# Patient Record
Sex: Female | Born: 1985 | Race: White | Hispanic: No | Marital: Married | Smoking: Never smoker
Health system: Southern US, Community
[De-identification: ages and names within clinical notes are randomized; demographics above are authoritative.]

## PROBLEM LIST (undated history)

## (undated) ENCOUNTER — Inpatient Hospital Stay (HOSPITAL_COMMUNITY): Payer: Self-pay

## (undated) DIAGNOSIS — E282 Polycystic ovarian syndrome: Secondary | ICD-10-CM

## (undated) DIAGNOSIS — Z973 Presence of spectacles and contact lenses: Secondary | ICD-10-CM

## (undated) DIAGNOSIS — Z8759 Personal history of other complications of pregnancy, childbirth and the puerperium: Secondary | ICD-10-CM

## (undated) DIAGNOSIS — Z789 Other specified health status: Secondary | ICD-10-CM

## (undated) DIAGNOSIS — Z87820 Personal history of traumatic brain injury: Secondary | ICD-10-CM

## (undated) DIAGNOSIS — O021 Missed abortion: Secondary | ICD-10-CM

## (undated) HISTORY — PX: OTHER SURGICAL HISTORY: SHX169

---
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
## 2016-04-02 ENCOUNTER — Encounter (HOSPITAL_COMMUNITY): Payer: Self-pay

## 2016-04-02 ENCOUNTER — Inpatient Hospital Stay (HOSPITAL_COMMUNITY)
Admission: AD | Admit: 2016-04-02 | Discharge: 2016-04-03 | Disposition: A | Discharge status: Home / Self Care | Payer: BLUE CROSS/BLUE SHIELD | Source: Ambulatory Visit | Attending: Obstetrics and Gynecology | Admitting: Obstetrics and Gynecology

## 2016-04-02 ENCOUNTER — Inpatient Hospital Stay (HOSPITAL_COMMUNITY): Payer: BLUE CROSS/BLUE SHIELD

## 2016-04-02 DIAGNOSIS — R109 Unspecified abdominal pain: Secondary | ICD-10-CM | POA: Diagnosis present

## 2016-04-02 DIAGNOSIS — O209 Hemorrhage in early pregnancy, unspecified: Secondary | ICD-10-CM

## 2016-04-02 DIAGNOSIS — O039 Complete or unspecified spontaneous abortion without complication: Secondary | ICD-10-CM | POA: Insufficient documentation

## 2016-04-02 DIAGNOSIS — O4691 Antepartum hemorrhage, unspecified, first trimester: Secondary | ICD-10-CM | POA: Diagnosis not present

## 2016-04-02 LAB — URINALYSIS, ROUTINE W REFLEX MICROSCOPIC
BILIRUBIN URINE: NEGATIVE
GLUCOSE, UA: NEGATIVE mg/dL
KETONES UR: NEGATIVE mg/dL
Leukocytes, UA: NEGATIVE
NITRITE: NEGATIVE
PH: 5.5 (ref 5.0–8.0)
Protein, ur: NEGATIVE mg/dL
SPECIFIC GRAVITY, URINE: 1.015 (ref 1.005–1.030)

## 2016-04-02 LAB — WET PREP, GENITAL
Clue Cells Wet Prep HPF POC: NONE SEEN
Sperm: NONE SEEN
TRICH WET PREP: NONE SEEN
YEAST WET PREP: NONE SEEN

## 2016-04-02 LAB — CBC
HCT: 38.4 % (ref 36.0–46.0)
Hemoglobin: 13.2 g/dL (ref 12.0–15.0)
MCH: 30.5 pg (ref 26.0–34.0)
MCHC: 34.4 g/dL (ref 30.0–36.0)
MCV: 88.7 fL (ref 78.0–100.0)
Platelets: 367 10*3/uL (ref 150–400)
RBC: 4.33 MIL/uL (ref 3.87–5.11)
RDW: 12.9 % (ref 11.5–15.5)
WBC: 10.7 10*3/uL — AB (ref 4.0–10.5)

## 2016-04-02 LAB — HCG, QUANTITATIVE, PREGNANCY: HCG, BETA CHAIN, QUANT, S: 66 m[IU]/mL — AB (ref <5)

## 2016-04-02 LAB — URINE MICROSCOPIC-ADD ON

## 2016-04-02 LAB — POCT PREGNANCY, URINE: Preg Test, Ur: POSITIVE — AB

## 2016-04-02 NOTE — MAU Provider Note (Signed)
MAU HISTORY AND PHYSICAL  Chief Complaint:  Possible Pregnancy; Vaginal Bleeding; and Abdominal Pain   Julie [name] is a 30 y.o.  G1P0 at Unknown presenting for Possible Pregnancy; Vaginal Bleeding; and Abdominal Pain  Positve upt 5 days ago. Cramping on and off, mild, since Tuesday, but much stronger and constant today. Spotting today, passed some clots. No fevers, no dysuria.   History reviewed. No pertinent past medical history.  Past Surgical History  Procedure Laterality Date  . Acl repair      No family history on file.  Social History  Substance Use Topics  . Smoking status: Never Smoker   . Smokeless tobacco: None  . Alcohol Use: No    No Known Allergies  Prescriptions prior to admission  Medication Sig Dispense Refill Last Dose  . acetaminophen (TYLENOL) 500 MG tablet Take 500 mg by mouth every 6 (six) hours as needed.   04/02/2016 at 1900  . Prenatal Vit-Fe Fumarate-FA (PRENATAL MULTIVITAMIN) TABS tablet Take 1 tablet by mouth daily at 12 noon.   04/02/2016 at Unknown time    Review of Systems - Negative except for what is mentioned in HPI.  Physical Exam  Blood pressure 138/76, pulse 70, temperature 97.9 F (36.6 C), temperature source Oral, resp. rate 16, height 5\' 4"  (1.626 m), last menstrual period 12/19/2015, SpO2 98 %. GENERAL: Well-developed, well-nourished female in no acute distress.  LUNGS: Clear to auscultation bilaterally.  HEART: Regular rate and rhythm. ABDOMEN: Soft, nontender, nondistended, gravid.  EXTREMITIES: Nontender, no edema, 2+ distal pulses. SSE: closed cervix, moderate amount dark red blood, no visible products   Labs: Results for orders placed or performed during the hospital encounter of 04/02/16 (from the past 24 hour(s))  Urinalysis, Routine w reflex microscopic (not [facility])   Collection Time: 04/02/16 10:17 PM  Result Value Ref Range   Color, Urine YELLOW YELLOW   APPearance CLEAR CLEAR   Specific Gravity, Urine 1.015 1.005  - 1.030   pH 5.5 5.0 - 8.0   Glucose, UA NEGATIVE NEGATIVE mg/dL   Hgb urine dipstick LARGE (A) NEGATIVE   Bilirubin Urine NEGATIVE NEGATIVE   Ketones, ur NEGATIVE NEGATIVE mg/dL   Protein, ur NEGATIVE NEGATIVE mg/dL   Nitrite NEGATIVE NEGATIVE   Leukocytes, UA NEGATIVE NEGATIVE  Urine microscopic-add on   Collection Time: 04/02/16 10:17 PM  Result Value Ref Range   Squamous Epithelial / LPF 0-5 (A) NONE SEEN   WBC, UA 0-5 0 - 5 WBC/hpf   RBC / HPF 0-5 0 - 5 RBC/hpf   Bacteria, UA RARE (A) NONE SEEN  Pregnancy, urine POC   Collection Time: 04/02/16 10:26 PM  Result Value Ref Range   Preg Test, Ur POSITIVE (A) NEGATIVE  Wet prep, genital   Collection Time: 04/02/16 10:55 PM  Result Value Ref Range   Yeast Wet Prep HPF POC NONE SEEN NONE SEEN   Trich, Wet Prep NONE SEEN NONE SEEN   Clue Cells Wet Prep HPF POC NONE SEEN NONE SEEN   WBC, Wet Prep HPF POC MODERATE (A) NONE SEEN   Sperm NONE SEEN   CBC   Collection Time: 04/02/16 11:03 PM  Result Value Ref Range   WBC 10.7 (H) 4.0 - 10.5 K/uL   RBC 4.33 3.87 - 5.11 MIL/uL   Hemoglobin 13.2 12.0 - 15.0 g/dL   HCT 04.538.4 40.936.0 - 81.146.0 %   MCV 88.7 78.0 - 100.0 fL   MCH 30.5 26.0 - 34.0 pg   MCHC 34.4 30.0 -  36.0 g/dL   RDW 16.1 09.6 - 04.5 %   Platelets 367 150 - 400 K/uL  hCG, quantitative, pregnancy   Collection Time: 04/02/16 11:03 PM  Result Value Ref Range   hCG, Beta Chain, Quant, S 66 (H) <5 mIU/mL  Type and screen   Collection Time: 04/02/16 11:03 PM  Result Value Ref Range   ABO/RH(D) A POS    Antibody Screen PENDING    Sample Expiration 04/05/2016     Imaging Studies:  US Ob Comp Less 14 Wks  04/02/2016  CLINICAL DATA:  Bleeding and cramping. First-trimester pregnancy. Quantitative beta HCG 66. Fifteen weeks gestational age by LMP, but irregular cycles. EXAM: OBSTETRIC <14 WK [place] AND TRANSVAGINAL OB US TECHNIQUE: Both transabdominal and transvaginal ultrasound examinations were performed for complete evaluation of  the gestation as well as the maternal uterus, adnexal regions, and pelvic cul-de-sac. Transvaginal technique was performed to assess early pregnancy. COMPARISON:  None. FINDINGS: No intra or extrauterine gestational sac is identified. No adnexal mass or hemoperitoneum. Corpus luteum is noted on the left. Cystic structures at the level of the upper cervix are attributed to nabothian cysts. IMPRESSION: Pregnancy of unknown location with negative pelvic ultrasound. Based on clinical dates findings suggest spontaneous abortion. If intrauterine gestation has not been previously documented possibilities include intrauterine gestation too early to be sonographically visualized, spontaneous abortion, or ectopic pregnancy. Consider follow-up ultrasound in 10 days and serial quantitative beta HCG follow-up. Electronically Signed   By: Marnee Spring M.D.   On: 04/02/2016 23:56   US Ob Transvaginal  04/02/2016  CLINICAL DATA:  Bleeding and cramping. First-trimester pregnancy. Quantitative beta HCG 66. Fifteen weeks gestational age by LMP, but irregular cycles. EXAM: OBSTETRIC <14 WK [place] AND TRANSVAGINAL OB US TECHNIQUE: Both transabdominal and transvaginal ultrasound examinations were performed for complete evaluation of the gestation as well as the maternal uterus, adnexal regions, and pelvic cul-de-sac. Transvaginal technique was performed to assess early pregnancy. COMPARISON:  None. FINDINGS: No intra or extrauterine gestational sac is identified. No adnexal mass or hemoperitoneum. Corpus luteum is noted on the left. Cystic structures at the level of the upper cervix are attributed to nabothian cysts. IMPRESSION: Pregnancy of unknown location with negative pelvic ultrasound. Based on clinical dates findings suggest spontaneous abortion. If intrauterine gestation has not been previously documented possibilities include intrauterine gestation too early to be sonographically visualized, spontaneous abortion, or ectopic  pregnancy. Consider follow-up ultrasound in 10 days and serial quantitative beta HCG follow-up. Electronically Signed   By: Marnee Spring M.D.   On: 04/02/2016 23:56    Assessment: Georgenia Salim is  29 y.o. G1P0 at Unknown presents with likely SAB. History of worsening cramping and passage of clots today suggestive, cervix closed on exam, and on u/s empty uterus, with hcg less than 100. Rh positive. No signs ectopic on u/s. Hemodynamically stable, bleeding currently is mild. No signs infection. Discussed w/ Dr. Henderson Cloud.  Plan: - clinic f/u one week - ectopic, bleeding, and infection return precautions  Cherrie Gauze [facility] 6/4/201712:03 AM

## 2016-04-02 NOTE — MAU Note (Signed)
Pt c/o lower abdominal cramping that started since she found out she was pregnant on Tuesday but today is much worse. Rates 3/10-took tylenol around 7pm. Helped a little. Started having some vaginal bleeding today. Noticed some small clots today-no bigger than a dime. LMP: 12/19/2015.

## 2016-04-03 DIAGNOSIS — O4691 Antepartum hemorrhage, unspecified, first trimester: Secondary | ICD-10-CM

## 2016-04-03 LAB — ABO/RH: ABO/RH(D): A POS

## 2016-04-03 LAB — TYPE AND SCREEN
ABO/RH(D): A POS
Antibody Screen: NEGATIVE

## 2016-04-03 NOTE — Discharge Instructions (Signed)

## 2016-04-04 LAB — GC/CHLAMYDIA PROBE AMP (~~LOC~~) NOT AT ARMC
Chlamydia: NEGATIVE
NEISSERIA GONORRHEA: NEGATIVE

## 2017-02-05 ENCOUNTER — Encounter (HOSPITAL_COMMUNITY): Payer: Self-pay

## 2017-05-25 LAB — OB RESULTS CONSOLE ABO/RH: RH TYPE: POSITIVE

## 2017-05-25 LAB — OB RESULTS CONSOLE RPR: RPR: NONREACTIVE

## 2017-05-25 LAB — OB RESULTS CONSOLE GC/CHLAMYDIA
Chlamydia: NEGATIVE
Gonorrhea: NEGATIVE

## 2017-05-25 LAB — OB RESULTS CONSOLE RUBELLA ANTIBODY, IGM: RUBELLA: IMMUNE

## 2017-05-25 LAB — OB RESULTS CONSOLE ANTIBODY SCREEN: Antibody Screen: NEGATIVE

## 2017-05-25 LAB — OB RESULTS CONSOLE HEPATITIS B SURFACE ANTIGEN: HEP B S AG: NEGATIVE

## 2017-05-25 LAB — OB RESULTS CONSOLE HIV ANTIBODY (ROUTINE TESTING): HIV: NONREACTIVE

## 2017-08-02 IMAGING — US US OB TRANSVAGINAL
1 series · 15 of 28 positions shown · non-contrast
Comparison: None.

CLINICAL DATA: Bleeding and cramping. First-trimester pregnancy.
Quantitative beta HCG 66. Fifteen weeks gestational age by LMP, but
irregular cycles.

EXAM:
OBSTETRIC <14 WK US AND TRANSVAGINAL OB US
TECHNIQUE: Both transabdominal and transvaginal ultrasound examinations were
performed for complete evaluation of the gestation as well as the
maternal uterus, adnexal regions, and pelvic cul-de-sac.
Transvaginal technique was performed to assess early pregnancy.

[Series 1: us ob transvaginal · 15 of 39 slices shown]
[im 1/39]
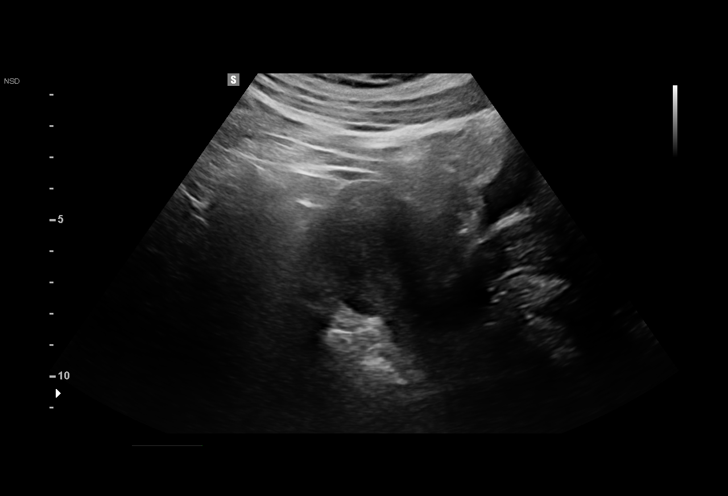
[im 3/39]
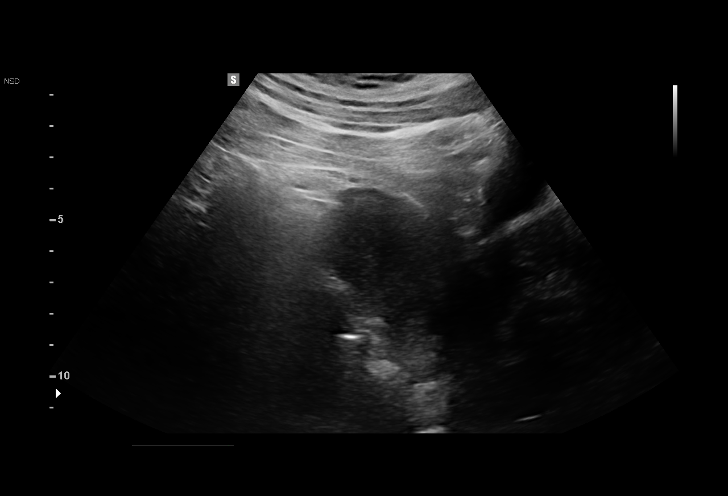
[im 6/39]
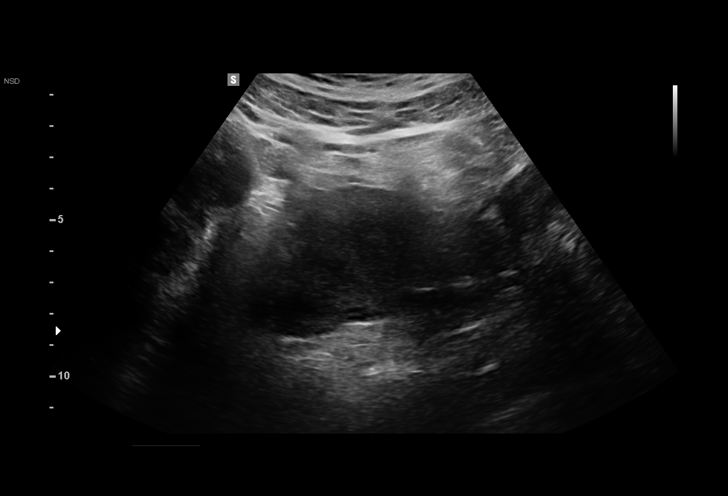
[im 9/39]
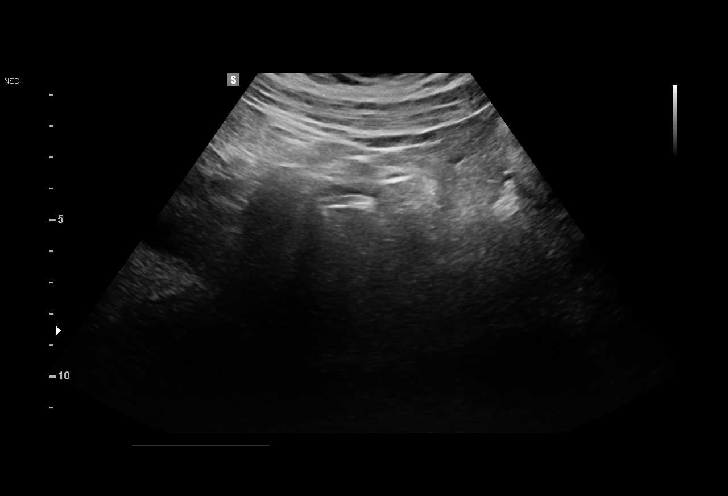
[im 12/39]
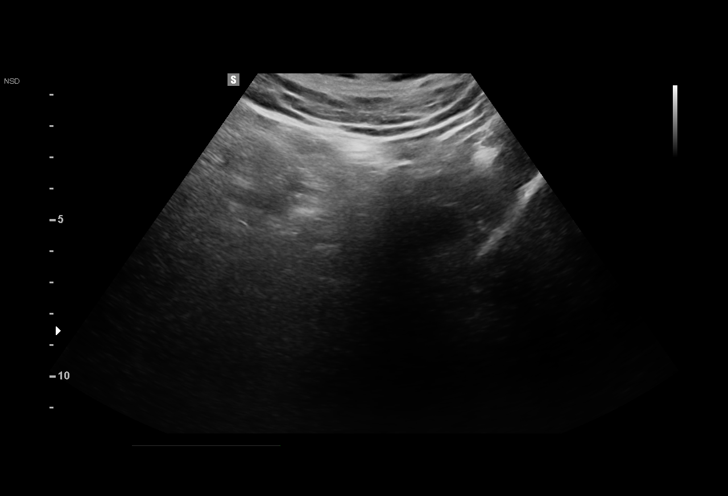
[im 15/39]
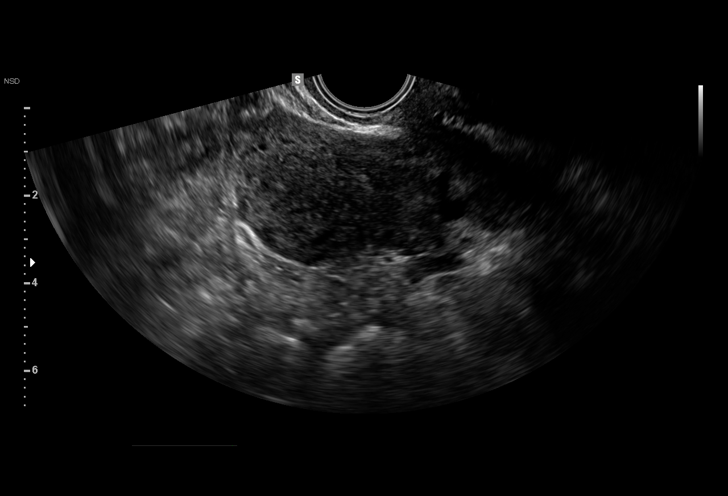
[im 17/39]
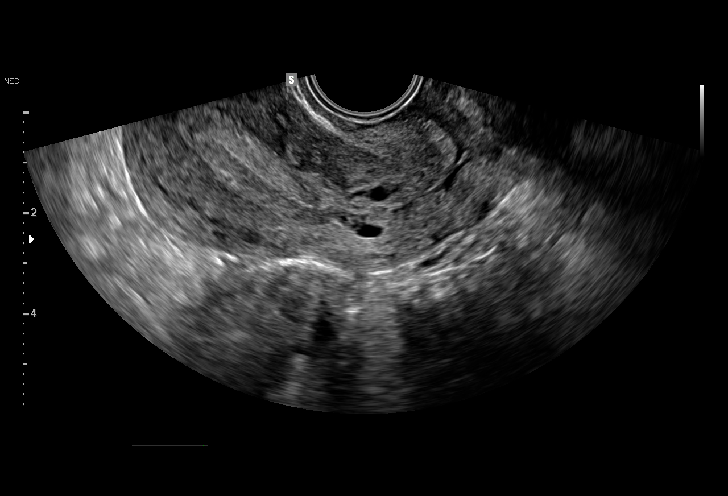
[im 20/39]
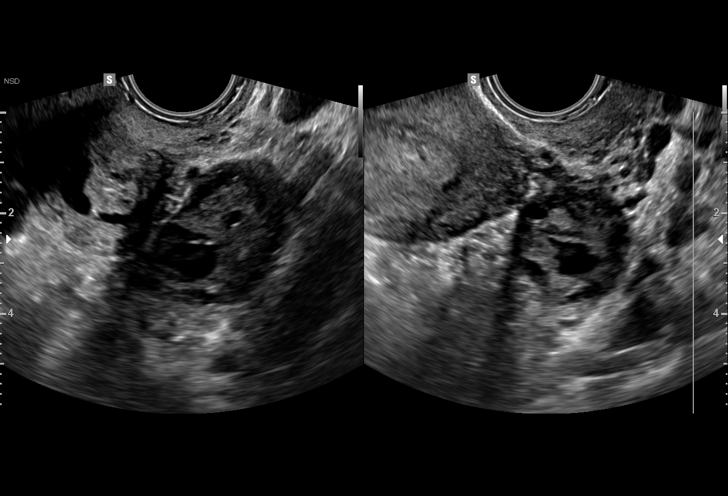
[im 22/39]
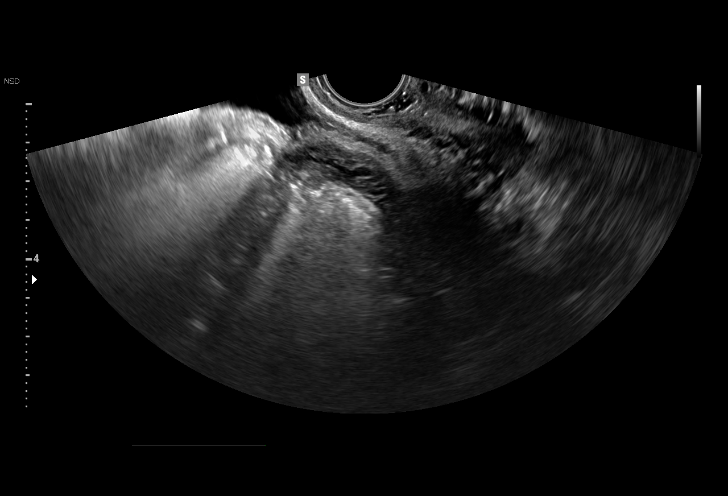
[im 24/39]
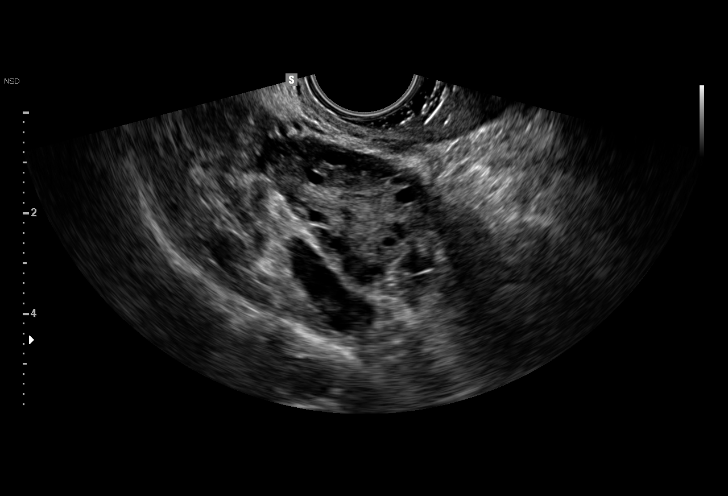
[im 27/39]
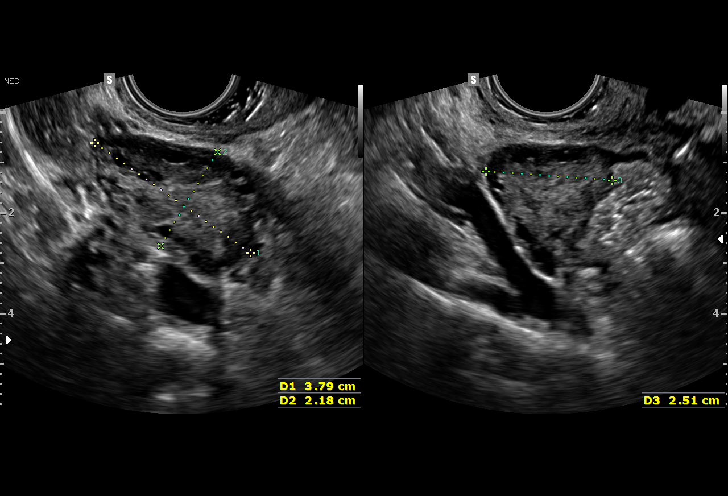
[im 30/39]
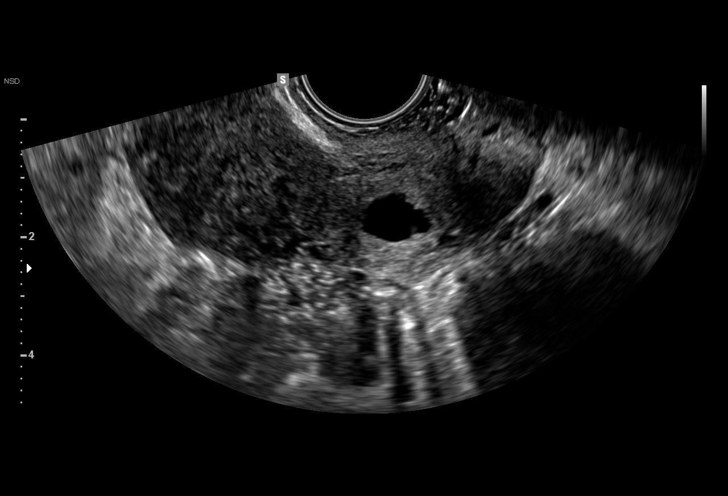
[im 33/39]
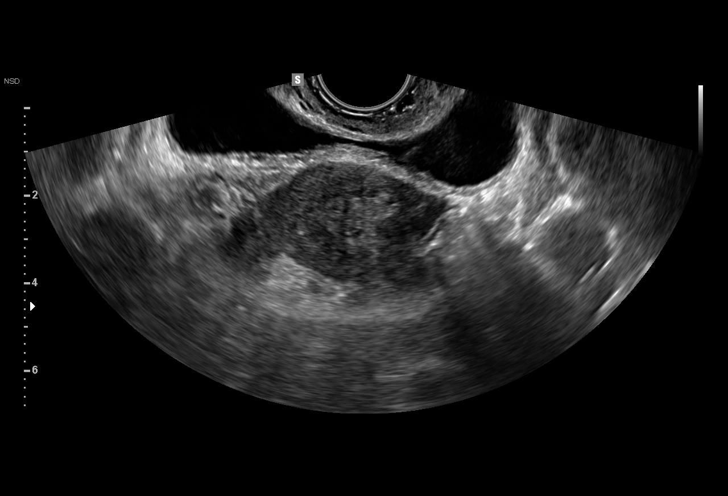
[im 36/39]
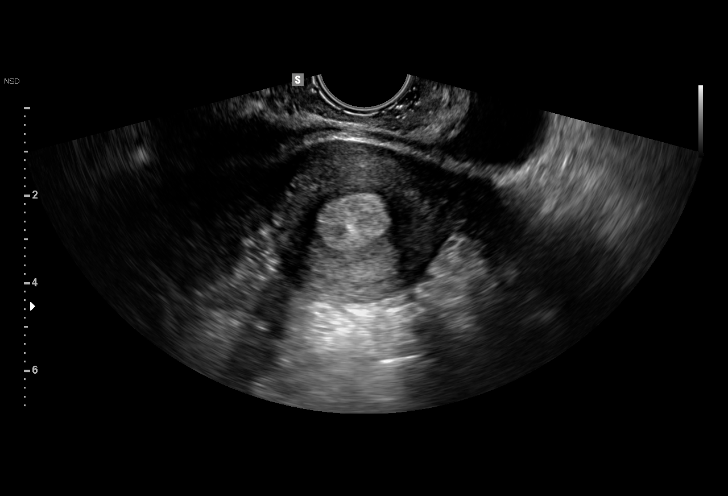
[im 39/39]
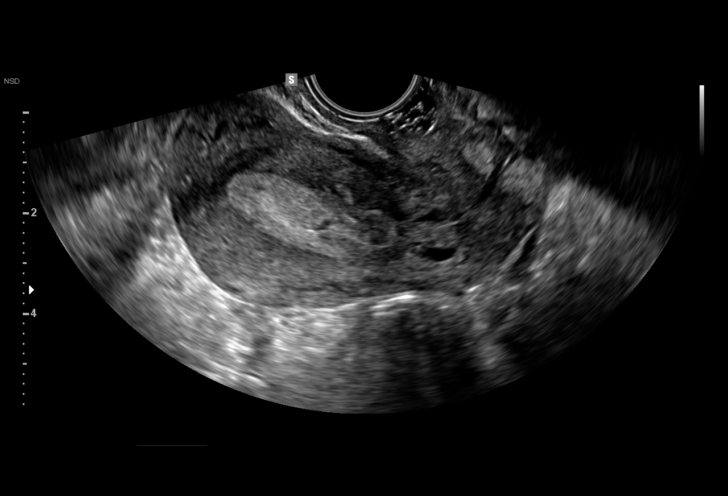

[15 of 28 positions shown; findings below may reference images not displayed]

FINDINGS: No intra or extrauterine gestational sac is identified. No adnexal
mass or hemoperitoneum. Corpus luteum is noted on the left. Cystic
structures at the level of the upper cervix are attributed to
nabothian cysts.
IMPRESSION: Pregnancy of unknown location with negative pelvic ultrasound. Based
on clinical dates findings suggest spontaneous abortion. If
intrauterine gestation has not been previously documented
possibilities include intrauterine gestation too early to be
sonographically visualized, spontaneous abortion, or ectopic
pregnancy. Consider follow-up ultrasound in 10 days and serial
quantitative beta HCG follow-up.

## 2017-10-03 ENCOUNTER — Inpatient Hospital Stay (HOSPITAL_COMMUNITY)
Admission: AD | Admit: 2017-10-03 | Discharge: 2017-10-03 | Disposition: A | Discharge status: Home / Self Care | Payer: BC Managed Care – PPO | Source: Ambulatory Visit | Attending: Obstetrics and Gynecology | Admitting: Obstetrics and Gynecology

## 2017-10-03 ENCOUNTER — Encounter (HOSPITAL_COMMUNITY): Payer: Self-pay

## 2017-10-03 DIAGNOSIS — R6889 Other general symptoms and signs: Secondary | ICD-10-CM

## 2017-10-03 DIAGNOSIS — Z3A29 29 weeks gestation of pregnancy: Secondary | ICD-10-CM | POA: Diagnosis not present

## 2017-10-03 DIAGNOSIS — J069 Acute upper respiratory infection, unspecified: Secondary | ICD-10-CM | POA: Diagnosis not present

## 2017-10-03 DIAGNOSIS — R509 Fever, unspecified: Secondary | ICD-10-CM | POA: Diagnosis present

## 2017-10-03 DIAGNOSIS — O26893 Other specified pregnancy related conditions, third trimester: Secondary | ICD-10-CM | POA: Insufficient documentation

## 2017-10-03 LAB — URINALYSIS, ROUTINE W REFLEX MICROSCOPIC
Bilirubin Urine: NEGATIVE
GLUCOSE, UA: NEGATIVE mg/dL
HGB URINE DIPSTICK: NEGATIVE
KETONES UR: NEGATIVE mg/dL
Leukocytes, UA: NEGATIVE
Nitrite: NEGATIVE
PH: 7 (ref 5.0–8.0)
Protein, ur: NEGATIVE mg/dL
Specific Gravity, Urine: 1.006 (ref 1.005–1.030)

## 2017-10-03 LAB — INFLUENZA PANEL BY PCR (TYPE A & B)
Influenza A By PCR: POSITIVE — AB
Influenza B By PCR: NEGATIVE

## 2017-10-03 MED ORDER — LACTATED RINGERS IV BOLUS (SEPSIS)
1000.0000 mL | Freq: Once | INTRAVENOUS | Status: AC
Start: 2017-10-03 — End: 2017-10-03
  Administered 2017-10-03: 1000 mL via INTRAVENOUS

## 2017-10-03 MED ORDER — ACETAMINOPHEN 500 MG PO TABS
500.0000 mg | ORAL_TABLET | Freq: Once | ORAL | Status: AC
  Administered 2017-10-03: 500 mg via ORAL
  Filled 2017-10-03: qty 1

## 2017-10-03 NOTE — MAU Provider Note (Signed)
Chief Complaint:  Fever; Chills; and Generalized Body Aches   First Provider Initiated Contact with Patient 10/03/17 [phone]      HPI: Julie [name] is a 31 y.o. G2P0 at 6040w0d who presents to maternity admissions reporting fever, chills, body aches, rhinorrhea/congestion x 24 hours. 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  She reports that taking Tylenol 500 mg every 4-6 hours has kept her fever below 100, mostly around 99, but at 4 am her fever was 101.0. She took another dose of Tylenol 500 mg at that time but the fever did not come down by 4:30 so she came to MAU.  She is feeling normal fetal movement. There are no other associated symptoms. She has not tried any other treatments.    HPI  Past Medical History: History reviewed. No pertinent past medical history.  Past obstetric history: OB History  Gravida Para Term Preterm AB Living  2            SAB TAB Ectopic Multiple Live Births               # Outcome Date GA Lbr Len/2nd Weight Sex Delivery Anes PTL Lv  2 Current           1 Gravida               Past Surgical History: Past Surgical History:  Procedure Laterality Date  . ACL repair      Family History: No family history on file.  Social History: Social History   Tobacco Use  . Smoking status: Never Smoker  Substance Use Topics  . Alcohol use: No  . Drug use: No    Allergies: No Known Allergies  Meds:  Medications Prior to Admission  Medication Sig Dispense Refill Last Dose  . acetaminophen (TYLENOL) 500 MG tablet Take 500 mg by mouth every 6 (six) hours as needed.   10/03/2017 at 0400  . Prenatal Vit-Fe Fumarate-FA (PRENATAL MULTIVITAMIN) TABS tablet Take 1 tablet by mouth daily at 12 noon.   04/02/2016 at Unknown time    ROS:  Review of Systems  Constitutional: Positive for chills and fever. Negative for fatigue.  HENT: Positive for congestion, postnasal drip and rhinorrhea.   Eyes: Negative for visual  disturbance.  Respiratory: Negative for shortness of breath.   Cardiovascular: Negative for chest pain.  Gastrointestinal: Negative for abdominal pain, nausea and vomiting.  Genitourinary: Negative for difficulty urinating, dysuria, flank pain, pelvic pain, vaginal bleeding, vaginal discharge and vaginal pain.  Musculoskeletal: Positive for myalgias.  Neurological: Negative for dizziness and headaches.  Psychiatric/Behavioral: Negative.      I have reviewed patient's Past Medical Hx, Surgical Hx, Family Hx, Social Hx, medications and allergies.   Physical Exam   Patient Vitals for the past 24 hrs:  BP Temp Temp src Pulse Resp SpO2 Height Weight  10/03/17 0531 114/74 98.7 F (37.1 C) Oral (!) 121 20 96 % 5\' 4"  (1.626 m) 177 lb (80.3 kg)   Constitutional: Well-developed, well-nourished female in no acute distress.  HEART: mild tachycardia with normal heart sounds, regular rhythm RESP: normal effort, lung sounds clear and equal bilaterally GI: Abd soft, non-tender, gravid appropriate for gestational age.  MS: Extremities nontender, no edema, normal ROM Neurologic: Alert and oriented x 4.  GU: Neg CVAT.      FHT:  Baseline 155 , moderate variability, accelerations present, isolated variable x 1 Contractions: None on toco or to palpation  Labs: No results found for this or any previous visit (from the past 24 hour(s)).    Imaging:  No results found.  MAU Course/MDM: Pt presents with flu like symptoms and mild maternal tachycardia NST reviewed and reactive Flu swab collected LR x 1000 ml Tylenol 500 mg (Pt took 500 mg 2 hours prior to arrival) Pt declined symptom treatment with decongestants/expectorants in MAU Consult Dr Claiborne Billingsallahan with presentation, exam findings and test results.  D/C home, increase PO fluids, Tylenol 1000 mg Q 6 hours or 650 mg Q 4 hours for fever/pain, list of safe medications in pregnancy given Flu swab pending Pt discharge with strict  fever/respiratory precautions.    Assessment:  1. Viral upper respiratory tract infection   2. Flu-like symptoms    Plan: Discharge home Labor precautions and fetal kick counts    Sharen CounterLisa Leftwich-Kirby Certified Nurse-Midwife 10/03/2017 6:28 AM

## 2017-10-03 NOTE — MAU Note (Signed)
Pt here with fever, chills, body aches, headache, hip pain in left side. Denies any bleeding or leaking. Reports positive fetal movement.

## 2017-11-16 ENCOUNTER — Encounter (HOSPITAL_COMMUNITY): Payer: Self-pay | Admitting: *Deleted

## 2017-11-16 ENCOUNTER — Other Ambulatory Visit: Payer: Self-pay

## 2017-11-16 ENCOUNTER — Inpatient Hospital Stay (HOSPITAL_COMMUNITY)
Admission: AD | Admit: 2017-11-16 | Discharge: 2017-11-16 | Disposition: A | Discharge status: Home / Self Care | Payer: BC Managed Care – PPO | Source: Ambulatory Visit | Attending: Obstetrics and Gynecology | Admitting: Obstetrics and Gynecology

## 2017-11-16 DIAGNOSIS — O133 Gestational [pregnancy-induced] hypertension without significant proteinuria, third trimester: Secondary | ICD-10-CM | POA: Diagnosis not present

## 2017-11-16 DIAGNOSIS — Z3A35 35 weeks gestation of pregnancy: Secondary | ICD-10-CM | POA: Diagnosis not present

## 2017-11-16 DIAGNOSIS — Z79899 Other long term (current) drug therapy: Secondary | ICD-10-CM | POA: Insufficient documentation

## 2017-11-16 DIAGNOSIS — O163 Unspecified maternal hypertension, third trimester: Secondary | ICD-10-CM | POA: Insufficient documentation

## 2017-11-16 HISTORY — DX: Other specified health status: Z78.9

## 2017-11-16 LAB — CBC
HCT: 34.2 % — ABNORMAL LOW (ref 36.0–46.0)
Hemoglobin: 11.8 g/dL — ABNORMAL LOW (ref 12.0–15.0)
MCH: 31.2 pg (ref 26.0–34.0)
MCHC: 34.5 g/dL (ref 30.0–36.0)
MCV: 90.5 fL (ref 78.0–100.0)
Platelets: 340 10*3/uL (ref 150–400)
RBC: 3.78 MIL/uL — ABNORMAL LOW (ref 3.87–5.11)
RDW: 13.2 % (ref 11.5–15.5)
WBC: 10.4 10*3/uL (ref 4.0–10.5)

## 2017-11-16 LAB — COMPREHENSIVE METABOLIC PANEL
ALT: 16 U/L (ref 14–54)
AST: 19 U/L (ref 15–41)
Albumin: 3.4 g/dL — ABNORMAL LOW (ref 3.5–5.0)
Alkaline Phosphatase: 182 U/L — ABNORMAL HIGH (ref 38–126)
Anion gap: 13 (ref 5–15)
BUN: 10 mg/dL (ref 6–20)
CHLORIDE: 103 mmol/L (ref 101–111)
CO2: 19 mmol/L — ABNORMAL LOW (ref 22–32)
Calcium: 9 mg/dL (ref 8.9–10.3)
Creatinine, Ser: 0.55 mg/dL (ref 0.44–1.00)
Glucose, Bld: 77 mg/dL (ref 65–99)
POTASSIUM: 4.1 mmol/L (ref 3.5–5.1)
Sodium: 135 mmol/L (ref 135–145)
Total Bilirubin: 0.5 mg/dL (ref 0.3–1.2)
Total Protein: 7 g/dL (ref 6.5–8.1)

## 2017-11-16 LAB — URINALYSIS, ROUTINE W REFLEX MICROSCOPIC
BILIRUBIN URINE: NEGATIVE
Glucose, UA: NEGATIVE mg/dL
Hgb urine dipstick: NEGATIVE
KETONES UR: NEGATIVE mg/dL
Leukocytes, UA: NEGATIVE
NITRITE: NEGATIVE
Protein, ur: NEGATIVE mg/dL
Specific Gravity, Urine: 1.005 (ref 1.005–1.030)
pH: 7 (ref 5.0–8.0)

## 2017-11-16 LAB — OB RESULTS CONSOLE GBS: GBS: NEGATIVE

## 2017-11-16 LAB — PROTEIN / CREATININE RATIO, URINE: CREATININE, URINE: 31 mg/dL

## 2017-11-16 NOTE — MAU Provider Note (Signed)
History     CSN: [id number]  Arrival date and time: 11/16/17 1614   Chief Complaint  Patient presents with  . Hypertension   HPI Julie [name] is a 32 y.o. G2P0 at 5916w2d who presents from the office for evaluation of blood pressure. She states it was 140s/80s in the office today and has gradually been increasing. She denies any headache, visual changes or epigastric pain. Denies any contractions, vaginal bleeding or leaking of fluid. Reports good fetal movement.   OB History    Gravida Para Term Preterm AB Living   2             SAB TAB Ectopic Multiple Live Births                  Past Medical History:  Diagnosis Date  . Medical history non-contributory     Past Surgical History:  Procedure Laterality Date  . ACL repair      Family History  Problem Relation Age of Onset  . Hypertension Father   . Heart disease Father     Social History   Tobacco Use  . Smoking status: Never Smoker  . Smokeless tobacco: Former Engineer, waterUser  Substance Use Topics  . Alcohol use: No  . Drug use: No    Allergies: No Known Allergies  Medications Prior to Admission  Medication Sig Dispense Refill Last Dose  . acetaminophen (TYLENOL) 500 MG tablet Take 500 mg by mouth every 6 (six) hours as needed.   10/03/2017 at 0400  . Prenatal Vit-Fe Fumarate-FA (PRENATAL MULTIVITAMIN) TABS tablet Take 1 tablet by mouth daily at 12 noon.   04/02/2016 at Unknown time    Review of Systems  Constitutional: Negative.  Negative for fatigue and fever.  HENT: Negative.   Respiratory: Negative.  Negative for shortness of breath.   Cardiovascular: Negative.  Negative for chest pain.  Gastrointestinal: Negative.  Negative for abdominal pain, constipation, diarrhea, nausea and vomiting.  Genitourinary: Negative.  Negative for dysuria.  Neurological: Negative.  Negative for dizziness and headaches.   Physical Exam   Blood pressure 131/87, pulse 82, temperature 98.3 F (36.8 C), temperature source Oral,  resp. rate 18, weight 193 lb 4 oz (87.7 kg), last menstrual period 02/23/2017, SpO2 99 %, unknown if currently breastfeeding.  Physical Exam  Nursing note and vitals reviewed. Constitutional: She is oriented to person, place, and time. She appears well-developed and well-nourished. No distress.  HENT:  Head: Normocephalic.  Eyes: Pupils are equal, round, and reactive to light.  Cardiovascular: Normal rate, regular rhythm and normal heart sounds.  Respiratory: Effort normal and breath sounds normal. No respiratory distress.  GI: Soft. Bowel sounds are normal. She exhibits no distension. There is no tenderness.  Neurological: She is alert and oriented to person, place, and time.  Skin: Skin is warm and dry.  Psychiatric: She has a normal mood and affect. Her behavior is normal. Judgment and thought content normal.   Fetal Tracing:  Baseline: 130 Variability: moderate Accels: 15x15 Decels: none  Toco: irregular uc's  Patient Vitals for the past 24 hrs:  BP Temp Temp src Pulse Resp SpO2 Weight  11/16/17 1830 131/87 - - 82 - - -  11/16/17 1815 126/86 - - 80 - - -  11/16/17 1800 132/82 - - 79 - - -  11/16/17 1745 138/88 - - 85 - - -  11/16/17 1730 129/84 - - 81 - - -  11/16/17 1717 139/80 - - 76 - - -  11/16/17 1645 (!) 153/88 98.3 F (36.8 C) Oral 75 18 99 % 193 lb 4 oz (87.7 kg)    MAU Course  Procedures Results for orders placed or performed during the hospital encounter of 11/16/17 (from the past 24 hour(s))  Protein / creatinine ratio, urine     Status: None   Collection Time: 11/16/17  4:48 PM  Result Value Ref Range   Creatinine, Urine 31.00 mg/dL   Total Protein, Urine <6 mg/dL   Protein Creatinine Ratio        0.00 - 0.15 mg/mg[Cre]  Urinalysis, Routine w reflex microscopic     Status: Abnormal   Collection Time: 11/16/17  4:48 PM  Result Value Ref Range   Color, Urine STRAW (A) YELLOW   APPearance CLEAR CLEAR   Specific Gravity, Urine 1.005 1.005 - 1.030   pH  7.0 5.0 - 8.0   Glucose, UA NEGATIVE NEGATIVE mg/dL   Hgb urine dipstick NEGATIVE NEGATIVE   Bilirubin Urine NEGATIVE NEGATIVE   Ketones, ur NEGATIVE NEGATIVE mg/dL   Protein, ur NEGATIVE NEGATIVE mg/dL   Nitrite NEGATIVE NEGATIVE   Leukocytes, UA NEGATIVE NEGATIVE  CBC     Status: Abnormal   Collection Time: 11/16/17  5:00 PM  Result Value Ref Range   WBC 10.4 4.0 - 10.5 K/uL   RBC 3.78 (L) 3.87 - 5.11 MIL/uL   Hemoglobin 11.8 (L) 12.0 - 15.0 g/dL   HCT 09.8 (L) 11.9 - 14.7 %   MCV 90.5 78.0 - 100.0 fL   MCH 31.2 26.0 - 34.0 pg   MCHC 34.5 30.0 - 36.0 g/dL   RDW 82.9 56.2 - 13.0 %   Platelets 340 150 - 400 K/uL  Comprehensive metabolic panel     Status: Abnormal   Collection Time: 11/16/17  5:00 PM  Result Value Ref Range   Sodium 135 135 - 145 mmol/L   Potassium 4.1 3.5 - 5.1 mmol/L   Chloride 103 101 - 111 mmol/L   CO2 19 (L) 22 - 32 mmol/L   Glucose, Bld 77 65 - 99 mg/dL   BUN 10 6 - 20 mg/dL   Creatinine, Ser 8.65 0.44 - 1.00 mg/dL   Calcium 9.0 8.9 - 78.4 mg/dL   Total Protein 7.0 6.5 - 8.1 g/dL   Albumin 3.4 (L) 3.5 - 5.0 g/dL   AST 19 15 - 41 U/L   ALT 16 14 - 54 U/L   Alkaline Phosphatase 182 (H) 38 - 126 U/L   Total Bilirubin 0.5 0.3 - 1.2 mg/dL   GFR calc non Af Amer >60 >60 mL/min   GFR calc Af Amer >60 >60 mL/min   Anion gap 13 5 - 15   MDM UA CBC, CMP, protein/creat ratio Reviewed with Dr. Dareen Piano- ok to discharge patient home to follow up on Monday in the office Assessment and Plan   1. Transient hypertension of pregnancy in third trimester   2. [redacted] weeks gestation of pregnancy    -Discharge home in stable condition -Preeclampsia precautions discussed -Patient advised to follow-up with [facility] on  -Patient may return to MAU as needed or if her condition were to change or worsen    Rolm Bookbinder CNM 11/16/2017, 6:38 PM

## 2017-11-16 NOTE — Discharge Instructions (Signed)
Hypertension During Pregnancy °Hypertension, commonly called high blood pressure, is when the force of blood pumping through your arteries is too strong. Arteries are blood vessels that carry blood from the heart throughout the body. Hypertension during pregnancy can cause problems for you and your baby. Your baby may be born early (prematurely) or may not weigh as much as he or she should at birth. Very bad cases of hypertension during pregnancy can be life-threatening. °Different types of hypertension can occur during pregnancy. These include: °· Chronic hypertension. This happens when: °? You have hypertension before pregnancy and it continues during pregnancy. °? You develop hypertension before you are [redacted] weeks pregnant, and it continues during pregnancy. °· Gestational hypertension. This is hypertension that develops after the 20th week of pregnancy. °· Preeclampsia, also called toxemia of pregnancy. This is a very serious type of hypertension that develops only during pregnancy. It affects the whole body, and it can be very dangerous for you and your baby. ° °Gestational hypertension and preeclampsia usually go away within 6 weeks after your baby is born. Women who have hypertension during pregnancy have a greater chance of developing hypertension later in life or during future pregnancies. °What are the causes? °The exact cause of hypertension is not known. °What increases the risk? °There are certain factors that make it more likely for you to develop hypertension during pregnancy. These include: °· Having hypertension during a previous pregnancy or prior to pregnancy. °· Being overweight. °· Being older than age 40. °· Being pregnant for the first time or being pregnant with more than one baby. °· Becoming pregnant using fertilization methods such as IVF (in vitro fertilization). °· Having diabetes, kidney problems, or systemic lupus erythematosus. °· Having a family history of hypertension. ° °What are the  signs or symptoms? °Chronic hypertension and gestational hypertension rarely cause symptoms. Preeclampsia causes symptoms, which may include: °· Increased protein in your urine. Your health care provider will check for this at every visit before you give birth (prenatal visit). °· Severe headaches. °· Sudden weight gain. °· Swelling of the hands, face, legs, and feet. °· Nausea and vomiting. °· Vision problems, such as blurred or double vision. °· Numbness in the face, arms, legs, and feet. °· Dizziness. °· Slurred speech. °· Sensitivity to bright lights. °· Abdominal pain. °· Convulsions. ° °How is this diagnosed? °You may be diagnosed with hypertension during a routine prenatal exam. At each prenatal visit, you may: °· Have a urine test to check for high amounts of protein in your urine. °· Have your blood pressure checked. A blood pressure reading is recorded as two numbers, such as "120 over 80" (or 120/80). The first ("top") number is called the systolic pressure. It is a measure of the pressure in your arteries when your heart beats. The second ("bottom") number is called the diastolic pressure. It is a measure of the pressure in your arteries as your heart relaxes between beats. Blood pressure is measured in a unit called mm Hg. A normal blood pressure reading is: °? Systolic: below 120. °? Diastolic: below 80. ° °The type of hypertension that you are diagnosed with depends on your test results and when your symptoms developed. °· Chronic hypertension is usually diagnosed before 20 weeks of pregnancy. °· Gestational hypertension is usually diagnosed after 20 weeks of pregnancy. °· Hypertension with high amounts of protein in the urine is diagnosed as preeclampsia. °· Blood pressure measurements that stay above 160 systolic, or above 110 diastolic, are   signs of severe preeclampsia. ° °How is this treated? °Treatment for hypertension during pregnancy varies depending on the type of hypertension you have and how  serious it is. °· If you take medicines called ACE inhibitors to treat chronic hypertension, you may need to switch medicines. ACE inhibitors should not be taken during pregnancy. °· If you have gestational hypertension, you may need to take blood pressure medicine. °· If you are at risk for preeclampsia, your health care provider may recommend that you take a low-dose aspirin every day to prevent high blood pressure during your pregnancy. °· If you have severe preeclampsia, you may need to be hospitalized so you and your baby can be monitored closely. You may also need to take medicine (magnesium sulfate) to prevent seizures and to lower blood pressure. This medicine may be given as an injection or through an IV tube. °· In some cases, if your condition gets worse, you may need to deliver your baby early. ° °Follow these instructions at home: °Eating and drinking °· Drink enough fluid to keep your urine clear or pale yellow. °· Eat a healthy diet that is low in salt (sodium). Do not add salt to your food. Check food labels to see how much sodium a food or beverage contains. °Lifestyle °· Do not use any products that contain nicotine or tobacco, such as cigarettes and e-cigarettes. If you need help quitting, ask your health care provider. °· Do not use alcohol. °· Avoid caffeine. °· Avoid stress as much as possible. Rest and get plenty of sleep. °General instructions °· Take over-the-counter and prescription medicines only as told by your health care provider. °· While lying down, lie on your left side. This keeps pressure off your baby. °· While sitting or lying down, raise (elevate) your feet. Try putting some pillows under your lower legs. °· Exercise regularly. Ask your health care provider what kinds of exercise are best for you. °· Keep all prenatal and follow-up visits as told by your health care provider. This is important. °Contact a health care provider if: °· You have symptoms that your health care  provider told you may require more treatment or monitoring, such as: °? Fever. °? Vomiting. °? Headache. °Get help right away if: °· You have severe abdominal pain or vomiting that does not get better with treatment. °· You suddenly develop swelling in your hands, ankles, or face. °· You gain 4 lbs (1.8 kg) or more in 1 week. °· You develop vaginal bleeding, or you have blood in your urine. °· You do not feel your baby moving as much as usual. °· You have blurred or double vision. °· You have muscle twitching or sudden tightening (spasms). °· You have shortness of breath. °· Your lips or fingernails turn blue. °This information is not intended to replace advice given to you by your health care provider. Make sure you discuss any questions you have with your health care provider. °Document Released: 07/05/2011 Document Revised: 05/06/2016 Document Reviewed: 04/01/2016 °Elsevier Interactive Patient Education © 2018 [employer] ° °

## 2017-11-16 NOTE — MAU Note (Signed)
BP been going up past wk, was 140/85 today in office, sent in for further eval.  Denies HA, visual changes, epigastric pain or swelling.

## 2017-11-28 ENCOUNTER — Telehealth (HOSPITAL_COMMUNITY): Payer: Self-pay | Admitting: *Deleted

## 2017-11-28 ENCOUNTER — Encounter (HOSPITAL_COMMUNITY): Payer: Self-pay | Admitting: *Deleted

## 2017-11-28 NOTE — Telephone Encounter (Signed)
Preadmission screen  

## 2017-11-29 ENCOUNTER — Inpatient Hospital Stay (HOSPITAL_COMMUNITY): Payer: BC Managed Care – PPO | Admitting: Anesthesiology

## 2017-11-29 ENCOUNTER — Encounter (HOSPITAL_COMMUNITY): Payer: Self-pay

## 2017-11-29 ENCOUNTER — Inpatient Hospital Stay (HOSPITAL_COMMUNITY)
Admission: RE | Admit: 2017-11-29 | Discharge: 2017-12-02 | DRG: 788 | Disposition: A | Discharge status: Home / Self Care | Payer: BC Managed Care – PPO | Source: Ambulatory Visit | Attending: Obstetrics | Admitting: Obstetrics

## 2017-11-29 ENCOUNTER — Encounter (HOSPITAL_COMMUNITY)
Admission: RE | Disposition: A | Discharge status: Home / Self Care | Payer: Self-pay | Source: Ambulatory Visit | Attending: Obstetrics

## 2017-11-29 DIAGNOSIS — O133 Gestational [pregnancy-induced] hypertension without significant proteinuria, third trimester: Secondary | ICD-10-CM | POA: Diagnosis present

## 2017-11-29 DIAGNOSIS — Z3A37 37 weeks gestation of pregnancy: Secondary | ICD-10-CM | POA: Diagnosis not present

## 2017-11-29 DIAGNOSIS — O134 Gestational [pregnancy-induced] hypertension without significant proteinuria, complicating childbirth: Principal | ICD-10-CM | POA: Diagnosis present

## 2017-11-29 DIAGNOSIS — O43123 Velamentous insertion of umbilical cord, third trimester: Secondary | ICD-10-CM | POA: Diagnosis present

## 2017-11-29 DIAGNOSIS — O324XX0 Maternal care for high head at term, not applicable or unspecified: Secondary | ICD-10-CM | POA: Diagnosis present

## 2017-11-29 LAB — CBC
HEMATOCRIT: 38.4 % (ref 36.0–46.0)
Hemoglobin: 13.3 g/dL (ref 12.0–15.0)
MCH: 31 pg (ref 26.0–34.0)
MCHC: 34.6 g/dL (ref 30.0–36.0)
MCV: 89.5 fL (ref 78.0–100.0)
Platelets: 372 10*3/uL (ref 150–400)
RBC: 4.29 MIL/uL (ref 3.87–5.11)
RDW: 13.4 % (ref 11.5–15.5)
WBC: 10.4 10*3/uL (ref 4.0–10.5)

## 2017-11-29 LAB — PROTEIN / CREATININE RATIO, URINE
Creatinine, Urine: 41 mg/dL
Total Protein, Urine: <6 mg/dL

## 2017-11-29 LAB — TYPE AND SCREEN
ABO/RH(D): A POS
Antibody Screen: NEGATIVE

## 2017-11-29 LAB — COMPREHENSIVE METABOLIC PANEL
ALBUMIN: 3.2 g/dL — AB (ref 3.5–5.0)
ALT: 16 U/L (ref 14–54)
AST: 23 U/L (ref 15–41)
Alkaline Phosphatase: 221 U/L — ABNORMAL HIGH (ref 38–126)
Anion gap: 9 (ref 5–15)
BUN: 11 mg/dL (ref 6–20)
CHLORIDE: 106 mmol/L (ref 101–111)
CO2: 18 mmol/L — ABNORMAL LOW (ref 22–32)
Calcium: 9.5 mg/dL (ref 8.9–10.3)
Creatinine, Ser: 0.58 mg/dL (ref 0.44–1.00)
GFR calc Af Amer: >60 mL/min (ref >60)
GFR calc non Af Amer: >60 mL/min (ref >60)
GLUCOSE: 83 mg/dL (ref 65–99)
POTASSIUM: 4.6 mmol/L (ref 3.5–5.1)
SODIUM: 133 mmol/L — AB (ref 135–145)
Total Bilirubin: 0.4 mg/dL (ref 0.3–1.2)
Total Protein: 6.7 g/dL (ref 6.5–8.1)

## 2017-11-29 LAB — RPR: RPR: NONREACTIVE

## 2017-11-29 SURGERY — Surgical Case
Anesthesia: Epidural | Wound class: Clean Contaminated

## 2017-11-29 MED ORDER — DEXAMETHASONE SODIUM PHOSPHATE 10 MG/ML IJ SOLN
INTRAMUSCULAR | Status: DC | PRN
  Administered 2017-11-29: 10 mg via INTRAVENOUS

## 2017-11-29 MED ORDER — SODIUM CHLORIDE 0.9 % IR SOLN
Status: DC | PRN
  Administered 2017-11-29: 1

## 2017-11-29 MED ORDER — ONDANSETRON HCL 4 MG/2ML IJ SOLN: 4.0000 mg | Freq: Four times a day (QID) | INTRAMUSCULAR | Status: DC | PRN

## 2017-11-29 MED ORDER — FENTANYL CITRATE (PF) 100 MCG/2ML IJ SOLN: 50.0000 ug | INTRAMUSCULAR | Status: DC | PRN

## 2017-11-29 MED ORDER — EPHEDRINE 5 MG/ML INJ: 10.0000 mg | INTRAVENOUS | Status: DC | PRN

## 2017-11-29 MED ORDER — ONDANSETRON HCL 4 MG/2ML IJ SOLN
INTRAMUSCULAR | Status: DC | PRN
  Administered 2017-11-29: 4 mg via INTRAVENOUS

## 2017-11-29 MED ORDER — CEFAZOLIN SODIUM-DEXTROSE 2-4 GM/100ML-% IV SOLN
INTRAVENOUS | Status: AC
  Filled 2017-11-29: qty 100

## 2017-11-29 MED ORDER — SOD CITRATE-CITRIC ACID 500-334 MG/5ML PO SOLN
30.0000 mL | ORAL | Status: DC | PRN
  Administered 2017-11-29: 30 mL via ORAL
  Filled 2017-11-29: qty 15

## 2017-11-29 MED ORDER — PHENYLEPHRINE 40 MCG/ML (10ML) SYRINGE FOR IV PUSH (FOR BLOOD PRESSURE SUPPORT): 80.0000 ug | PREFILLED_SYRINGE | INTRAVENOUS | Status: DC | PRN

## 2017-11-29 MED ORDER — OXYTOCIN 10 UNIT/ML IJ SOLN
INTRAMUSCULAR | Status: AC
  Filled 2017-11-29: qty 4

## 2017-11-29 MED ORDER — MEPERIDINE HCL 25 MG/ML IJ SOLN
INTRAMUSCULAR | Status: DC | PRN
  Administered 2017-11-29 (×2): 12.5 mg via INTRAVENOUS

## 2017-11-29 MED ORDER — OXYTOCIN 40 UNITS IN LACTATED RINGERS INFUSION - SIMPLE MED
1.0000 m[IU]/min | INTRAVENOUS | Status: DC
  Administered 2017-11-29: 2 m[IU]/min via INTRAVENOUS
  Filled 2017-11-29: qty 1000

## 2017-11-29 MED ORDER — SODIUM BICARBONATE 8.4 % IV SOLN
INTRAVENOUS | Status: DC | PRN
  Administered 2017-11-29: 10 mL via EPIDURAL

## 2017-11-29 MED ORDER — LACTATED RINGERS IV SOLN
INTRAVENOUS | Status: DC
  Administered 2017-11-29 (×2): via INTRAVENOUS

## 2017-11-29 MED ORDER — OXYCODONE-ACETAMINOPHEN 5-325 MG PO TABS: 2.0000 | ORAL_TABLET | ORAL | Status: DC | PRN

## 2017-11-29 MED ORDER — LIDOCAINE HCL (PF) 1 % IJ SOLN
INTRAMUSCULAR | Status: DC | PRN
  Administered 2017-11-29: 13 mL via EPIDURAL

## 2017-11-29 MED ORDER — OXYCODONE-ACETAMINOPHEN 5-325 MG PO TABS: 1.0000 | ORAL_TABLET | ORAL | Status: DC | PRN

## 2017-11-29 MED ORDER — DEXAMETHASONE SODIUM PHOSPHATE 10 MG/ML IJ SOLN
INTRAMUSCULAR | Status: AC
  Filled 2017-11-29: qty 1

## 2017-11-29 MED ORDER — MEPERIDINE HCL 25 MG/ML IJ SOLN
INTRAMUSCULAR | Status: AC
  Filled 2017-11-29: qty 1

## 2017-11-29 MED ORDER — OXYTOCIN 40 UNITS IN LACTATED RINGERS INFUSION - SIMPLE MED: 2.5000 [IU]/h | INTRAVENOUS | Status: DC

## 2017-11-29 MED ORDER — SCOPOLAMINE 1 MG/3DAYS TD PT72
MEDICATED_PATCH | TRANSDERMAL | Status: AC
  Filled 2017-11-29: qty 1

## 2017-11-29 MED ORDER — PHENYLEPHRINE 40 MCG/ML (10ML) SYRINGE FOR IV PUSH (FOR BLOOD PRESSURE SUPPORT)
PREFILLED_SYRINGE | INTRAVENOUS | Status: AC
  Filled 2017-11-29: qty 10

## 2017-11-29 MED ORDER — CEFAZOLIN SODIUM-DEXTROSE 2-4 GM/100ML-% IV SOLN
2.0000 g | Freq: Once | INTRAVENOUS | Status: AC
  Administered 2017-11-29: 2 g via INTRAVENOUS

## 2017-11-29 MED ORDER — ACETAMINOPHEN 325 MG PO TABS: 650.0000 mg | ORAL_TABLET | ORAL | Status: DC | PRN

## 2017-11-29 MED ORDER — OXYTOCIN BOLUS FROM INFUSION: 500.0000 mL | Freq: Once | INTRAVENOUS | Status: DC

## 2017-11-29 MED ORDER — FENTANYL 2.5 MCG/ML BUPIVACAINE 1/10 % EPIDURAL INFUSION (WH - ANES)
14.0000 mL/h | INTRAMUSCULAR | Status: DC | PRN
  Administered 2017-11-29 (×2): 14 mL/h via EPIDURAL
  Filled 2017-11-29: qty 100

## 2017-11-29 MED ORDER — MORPHINE SULFATE (PF) 0.5 MG/ML IJ SOLN
INTRAMUSCULAR | Status: AC
  Filled 2017-11-29: qty 10

## 2017-11-29 MED ORDER — OXYTOCIN 10 UNIT/ML IJ SOLN
INTRAVENOUS | Status: DC | PRN
  Administered 2017-11-29: 40 [IU] via INTRAVENOUS

## 2017-11-29 MED ORDER — SCOPOLAMINE 1 MG/3DAYS TD PT72
MEDICATED_PATCH | TRANSDERMAL | Status: DC | PRN
  Administered 2017-11-29: 1 via TRANSDERMAL

## 2017-11-29 MED ORDER — LIDOCAINE HCL (PF) 1 % IJ SOLN
30.0000 mL | INTRAMUSCULAR | Status: DC | PRN
  Filled 2017-11-29: qty 30

## 2017-11-29 MED ORDER — LACTATED RINGERS IV SOLN: 500.0000 mL | INTRAVENOUS | Status: DC | PRN

## 2017-11-29 MED ORDER — LACTATED RINGERS IV SOLN
500.0000 mL | Freq: Once | INTRAVENOUS | Status: AC
  Administered 2017-11-29: 500 mL via INTRAVENOUS

## 2017-11-29 MED ORDER — ONDANSETRON HCL 4 MG/2ML IJ SOLN
INTRAMUSCULAR | Status: AC
  Filled 2017-11-29: qty 2

## 2017-11-29 MED ORDER — MORPHINE SULFATE (PF) 0.5 MG/ML IJ SOLN
INTRAMUSCULAR | Status: DC | PRN
  Administered 2017-11-29: 8 mg via EPIDURAL

## 2017-11-29 MED ORDER — TERBUTALINE SULFATE 1 MG/ML IJ SOLN: 0.2500 mg | Freq: Once | INTRAMUSCULAR | Status: DC | PRN

## 2017-11-29 MED ORDER — DIPHENHYDRAMINE HCL 50 MG/ML IJ SOLN: 12.5000 mg | INTRAMUSCULAR | Status: DC | PRN

## 2017-11-29 MED ORDER — PHENYLEPHRINE HCL 10 MG/ML IJ SOLN
INTRAMUSCULAR | Status: DC | PRN
  Administered 2017-11-29: 80 ug via INTRAVENOUS

## 2017-11-29 SURGICAL SUPPLY — 37 items
BENZOIN TINCTURE PRP APPL 2/3 (GAUZE/BANDAGES/DRESSINGS) ×3 IMPLANT
CHLORAPREP W/TINT 26ML (MISCELLANEOUS) ×3 IMPLANT
CLAMP CORD UMBIL (MISCELLANEOUS) IMPLANT
CLOSURE WOUND 1/2 X4 (GAUZE/BANDAGES/DRESSINGS) ×1
CLOTH BEACON ORANGE TIMEOUT ST (SAFETY) ×3 IMPLANT
DRSG OPSITE POSTOP 4X10 (GAUZE/BANDAGES/DRESSINGS) ×3 IMPLANT
ELECT REM PT RETURN 9FT ADLT (ELECTROSURGICAL) ×3
ELECTRODE REM PT RTRN 9FT ADLT (ELECTROSURGICAL) ×1 IMPLANT
EXTRACTOR VACUUM KIWI (MISCELLANEOUS) IMPLANT
GLOVE BIOGEL PI IND STRL 6.5 (GLOVE) ×1 IMPLANT
GLOVE BIOGEL PI IND STRL 7.0 (GLOVE) ×1 IMPLANT
GLOVE BIOGEL PI INDICATOR 6.5 (GLOVE) ×2
GLOVE BIOGEL PI INDICATOR 7.0 (GLOVE) ×2
GLOVE ECLIPSE 6.0 STRL STRAW (GLOVE) ×3 IMPLANT
GOWN STRL REUS W/TWL LRG LVL3 (GOWN DISPOSABLE) ×6 IMPLANT
KIT ABG SYR 3ML LUER SLIP (SYRINGE) IMPLANT
NEEDLE HYPO 25X5/8 SAFETYGLIDE (NEEDLE) IMPLANT
NS IRRIG 1000ML POUR BTL (IV SOLUTION) ×3 IMPLANT
PACK C SECTION WH (CUSTOM PROCEDURE TRAY) ×3 IMPLANT
PAD OB MATERNITY 4.3X12.25 (PERSONAL CARE ITEMS) ×3 IMPLANT
PENCIL SMOKE EVAC W/HOLSTER (ELECTROSURGICAL) ×3 IMPLANT
RTRCTR C-SECT PINK 25CM LRG (MISCELLANEOUS) ×3 IMPLANT
STRIP CLOSURE SKIN 1/2X4 (GAUZE/BANDAGES/DRESSINGS) ×2 IMPLANT
SUT MNCRL 0 VIOLET CTX 36 (SUTURE) ×3 IMPLANT
SUT MNCRL AB 3-0 PS2 27 (SUTURE) ×3 IMPLANT
SUT MON AB 2-0 SH 27 (SUTURE) ×2
SUT MON AB 2-0 SH27 (SUTURE) ×1 IMPLANT
SUT MONOCRYL 0 CTX 36 (SUTURE) ×6
SUT PLAIN 0 NONE (SUTURE) IMPLANT
SUT PLAIN 2 0 (SUTURE) ×2
SUT PLAIN ABS 2-0 CT1 27XMFL (SUTURE) ×1 IMPLANT
SUT VIC AB 0 CTX 36 (SUTURE) ×4
SUT VIC AB 0 CTX36XBRD ANBCTRL (SUTURE) ×2 IMPLANT
SUT VIC AB 2-0 CT1 27 (SUTURE) ×2
SUT VIC AB 2-0 CT1 TAPERPNT 27 (SUTURE) ×1 IMPLANT
TOWEL OR 17X24 6PK STRL BLUE (TOWEL DISPOSABLE) ×3 IMPLANT
TRAY FOLEY BAG SILVER LF 14FR (SET/KITS/TRAYS/PACK) ×3 IMPLANT

## 2017-11-29 NOTE — Transfer of Care (Signed)
Immediate Anesthesia Transfer of Care Note  Patient: Julie [name]  Procedure(s) Performed: CESAREAN SECTION (N/A )  Patient Location: PACU  Anesthesia Type:Epidural  Level of Consciousness: awake, alert , oriented and patient cooperative  Airway & Oxygen Therapy: Patient Spontanous Breathing  Post-op Assessment: Report given to RN, Post -op Vital signs reviewed and stable and Patient moving all extremities X 4  Post vital signs: Reviewed and stable  Last Vitals:  Vitals:   11/29/17 2130 11/29/17 2200  BP: (!) 120/99 112/65  Pulse: 98 (!) 102  Resp: 16   Temp:  (!) 38.5 C    Last Pain:  Vitals:   11/29/17 2200  TempSrc: Axillary  PainSc:          Complications: No apparent anesthesia complications

## 2017-11-29 NOTE — Anesthesia Pain Management Evaluation Note (Signed)
  CRNA Pain Management Visit Note  Patient: Julie [name], 32 y.o., female  "Hello I am a member of the anesthesia team at [facility]. We have an anesthesia team available at all times to provide care throughout the hospital, including epidural management and anesthesia for C-section. I don't know your plan for the delivery whether it a natural birth, water birth, IV sedation, nitrous supplementation, doula or epidural, but we want to meet your pain goals."   1.Was your pain managed to your expectations on prior hospitalizations?   No prior hospitalizations  2.What is your expectation for pain management during this hospitalization?     Labor support without medications, Epidural, IV pain meds and Nitrous Oxide  3.How can we help you reach that goal? This is her first baby and patient was open to discussion about all methods of pain control. Questions answered  Record the patient's initial score and the patient's pain goal.   Pain: 0  Pain Goal: 5 [facility] wants you to be able to say your pain was always managed very well.  Julie [name] 11/29/2017

## 2017-11-29 NOTE — Progress Notes (Signed)
Some discomfort w contractions.  BP (!) 141/89   Pulse 88   Temp 97.8 F (36.6 C) (Oral)   Resp 18   Ht 5\' 4"  (1.626 m)   Wt 88.5 kg (195 lb 1.3 oz)   LMP 02/23/2017   BMI 33.49 kg/m  Toco: q2-3 min EFM: 140s, mod var, + accels, no decels SVE: 4/80/-2  A&P: G2P0010 @ 6069w1d w IOL for ghtn IOL--cont pitocin, pt declined AROM ghtn--bps mild range gbs neg / vtx / fsr

## 2017-11-29 NOTE — Progress Notes (Signed)
Comfortable w epidural.  Following epidural placement, had an episode of relative hypotension with resultant decelerations.  She received an IVF bolus with resolution of decelerations  BP 133/79   Pulse 81   Temp 97.8 F (36.6 C) (Oral)   Resp 16   Ht 5\' 4"  (1.626 m)   Wt 88.5 kg (195 lb 1.3 oz)   LMP 02/23/2017   BMI 33.49 kg/m   Toco: q2-3 min EFM: 140s, mod var, occ variable decels  SVE: 7/90/-2  AROM clear fluid, IUPC placed  A/P: G2P0010 @ 6634w1d with IOL for GHTN Cont pitocin for IOL ghtn--bps mild range Fsr/vtx/gbs neg

## 2017-11-29 NOTE — H&P (Signed)
32 y.o. G2P0010 @ 7157w1d presents for IOL for GHTN.  Otherwise has good fetal movement and no bleeding.  Occasional cramping on admission today with mild nausea.  Denies HA/BV/RUQ pain  1. GHTN--diagnosed at 35 weeks 2.  Marginal cord insertion--normal 3rd trimester growth US at 34 week (30%)  Past Medical History:  Diagnosis Date  . Medical history non-contributory     Past Surgical History:  Procedure Laterality Date  . ACL repair      OB History  Gravida Para Term Preterm AB Living  2       1    SAB TAB Ectopic Multiple Live Births  1            # Outcome Date GA Lbr Len/2nd Weight Sex Delivery Anes PTL Lv  2 Current           1 SAB               Social History   Socioeconomic History  . Marital status: Married    Spouse name: Not on file  . Number of children: Not on file  . Years of education: Not on file  . Highest education level: Not on file  Tobacco Use  . Smoking status: Never Smoker  . Smokeless tobacco: Former Engineer, waterUser  Substance and Sexual Activity  . Alcohol use: No  . Drug use: No  . Sexual activity: Yes  Other Topics Concern  . Not on file  Social History Narrative  . Not on file   Latex    Prenatal Transfer Tool  Maternal Diabetes: No Genetic Screening: Normal NIPT Maternal Ultrasounds/Referrals: Abnormal:  Findings:   Other: Marginal cord insertion Fetal Ultrasounds or other Referrals:  None Maternal Substance Abuse:  No Significant Maternal Medications:  None Significant Maternal Lab Results: Lab values include: Group B Strep negative  ABO, Rh: A/Positive/-- (07/26 0000) Antibody: Negative (07/26 0000) Rubella: Immune (07/26 0000) RPR: Nonreactive (07/26 0000)  HBsAg: Negative (07/26 0000)  HIV: Non-reactive (07/26 0000)  GBS: Negative (01/17 0000)        Vitals:   11/29/17 0753 11/29/17 0812  BP: (!) 153/102 133/88  Pulse: 93 81  Resp: 18 16  Temp: 98 F (36.7 C)      General:  NAD Abdomen:  soft, gravid, EFW 6.5-7# SVE:   3/80/-2/posterior FHTs:  150s, mod var, + accels, no decels Toco:  q6-10 min   A/P   32 y.o. G2P0010 1357w1d presents with IOL for GHTN at term GHTN--BPs mild range on admission, platelets normal, LFTs pending IOL--will start pitocin--pt prefers delayed AROM--discussed potential impact on course of IOL NO/Epidural upon request FSR/ vtx/ GBS neg  [facility] South[facility]ANNA GEFFEL Jarold Macomber

## 2017-11-29 NOTE — Anesthesia Preprocedure Evaluation (Signed)
Anesthesia Evaluation  Patient identified by MRN, date of birth, ID band Patient awake    Reviewed: Allergy & Precautions, NPO status , Patient's Chart, lab work & pertinent test results  Airway Mallampati: II  TM Distance: >3 FB Neck ROM: Full    Dental no notable dental hx.    Pulmonary neg pulmonary ROS,    Pulmonary exam normal breath sounds clear to auscultation       Cardiovascular hypertension, negative cardio ROS Normal cardiovascular exam Rhythm:Regular Rate:Normal     Neuro/Psych negative neurological ROS  negative psych ROS   GI/Hepatic negative GI ROS, Neg liver ROS,   Endo/Other  negative endocrine ROS  Renal/GU negative Renal ROS  negative genitourinary   Musculoskeletal negative musculoskeletal ROS (+)   Abdominal   Peds negative pediatric ROS (+)  Hematology negative hematology ROS (+)   Anesthesia Other Findings   Reproductive/Obstetrics negative OB ROS (+) Pregnancy                             Anesthesia Physical Anesthesia Plan  ASA: II  Anesthesia Plan: Epidural   Post-op Pain Management:    Induction:   PONV Risk Score and Plan:   Airway Management Planned:   Additional Equipment:   Intra-op Plan:   Post-operative Plan:   Informed Consent:   Plan Discussed with:   Anesthesia Plan Comments:         Anesthesia Quick Evaluation  

## 2017-11-29 NOTE — Progress Notes (Signed)
Patient has been pushing with excellent effort for 3 hours.  Baby is LOP.  Multiple attempts at manual rotation have been unsuccessful as baby rotate back to OP.  Baby with progressive caput, but station has not progressed past 0 station.  She has a narrow anterior arch and prominent ischial spines, likely contributing to the persistent OP presentation.  At this time, I recommend proceeding to the operating room for arrest of descent.  Discussed risks to include infection, bleeding, damage to surrounding structures (including, but not limited to bowel, bladder, tubes, ovaries, nerves, vessels, baby), vte, need for additional procedures, need for blood transfusion.   With pushing, fetal baseline has gradually increased to the 170s, though mom has remained afebrile until her last temp immediately prior to rolling to OR--101.3, but no maternal tachycardia.    Will give 2gm ancef preop.

## 2017-11-29 NOTE — Anesthesia Procedure Notes (Signed)
Epidural Patient location during procedure: OB Start time: 11/29/2017 3:07 PM End time: 11/29/2017 3:21 PM  Staffing Anesthesiologist: Lowella CurbMiller, Warren Ray, MD Performed: anesthesiologist   Preanesthetic Checklist Completed: patient identified, site marked, surgical consent, pre-op evaluation, timeout performed, IV checked, risks and benefits discussed and monitors and equipment checked  Epidural Patient position: sitting Prep: ChloraPrep Patient monitoring: heart rate, cardiac monitor, continuous pulse ox and blood pressure Approach: midline Location: L2-L3 Injection technique: LOR saline  Needle:  Needle type: Tuohy  Needle gauge: 17 G Needle length: 9 cm Needle insertion depth: 6 cm Catheter type: closed end flexible Catheter size: 20 Guage Catheter at skin depth: 10 cm Test dose: negative  Assessment Events: blood not aspirated, injection not painful, no injection resistance, negative IV test and no paresthesia  Additional Notes Reason for block:procedure for pain

## 2017-11-29 NOTE — Brief Op Note (Signed)
11/29/2017  11:41 PM  PATIENT:  Julie [name]  32 y.o. female  PRE-OPERATIVE DIAGNOSIS:  Arrest of Descent  POST-OPERATIVE DIAGNOSIS:  Arrest of Descent  PROCEDURE:  Procedure(s): CESAREAN SECTION (N/A)  SURGEON:  Surgeon(s) and Role:    Marlow Baars* Julie Carrington, MD - Primary    * [name], Jethro BastosUgonna A, MD - Assisting  ANESTHESIA:   epidural  EBL:  426 mL   BLOOD ADMINISTERED:none  DRAINS: none   LOCAL MEDICATIONS USED:  NONE  SPECIMEN:  Source of Specimen:  placenta to L&D  DISPOSITION OF SPECIMEN:  N/A  COUNTS:  YES  TOURNIQUET:  * No tourniquets in log *  DICTATION: .Note written in EPIC  PLAN OF CARE: Admit to inpatient   PATIENT DISPOSITION:  PACU - hemodynamically stable.   Delay start of Pharmacological VTE agent (>24hrs) due to surgical blood loss or risk of bleeding: not applicable

## 2017-11-30 ENCOUNTER — Encounter (HOSPITAL_COMMUNITY): Payer: Self-pay

## 2017-11-30 LAB — CBC
HCT: 33 % — ABNORMAL LOW (ref 36.0–46.0)
HEMATOCRIT: 32.4 % — AB (ref 36.0–46.0)
HEMOGLOBIN: 11.1 g/dL — AB (ref 12.0–15.0)
Hemoglobin: 11.3 g/dL — ABNORMAL LOW (ref 12.0–15.0)
MCH: 30.9 pg (ref 26.0–34.0)
MCH: 30.9 pg (ref 26.0–34.0)
MCHC: 34.2 g/dL (ref 30.0–36.0)
MCHC: 34.3 g/dL (ref 30.0–36.0)
MCV: 90.2 fL (ref 78.0–100.0)
MCV: 90.3 fL (ref 78.0–100.0)
Platelets: 301 10*3/uL (ref 150–400)
Platelets: 315 10*3/uL (ref 150–400)
RBC: 3.59 MIL/uL — ABNORMAL LOW (ref 3.87–5.11)
RBC: 3.66 MIL/uL — ABNORMAL LOW (ref 3.87–5.11)
RDW: 13.5 % (ref 11.5–15.5)
RDW: 13.7 % (ref 11.5–15.5)
WBC: 21.2 10*3/uL — ABNORMAL HIGH (ref 4.0–10.5)
WBC: 27.6 10*3/uL — ABNORMAL HIGH (ref 4.0–10.5)

## 2017-11-30 MED ORDER — NALBUPHINE HCL 10 MG/ML IJ SOLN: 5.0000 mg | INTRAMUSCULAR | Status: DC | PRN

## 2017-11-30 MED ORDER — NALBUPHINE HCL 10 MG/ML IJ SOLN: 5.0000 mg | Freq: Once | INTRAMUSCULAR | Status: DC | PRN

## 2017-11-30 MED ORDER — KETOROLAC TROMETHAMINE 30 MG/ML IJ SOLN: 30.0000 mg | Freq: Four times a day (QID) | INTRAMUSCULAR | Status: AC | PRN

## 2017-11-30 MED ORDER — MEPERIDINE HCL 25 MG/ML IJ SOLN: 6.2500 mg | INTRAMUSCULAR | Status: DC | PRN

## 2017-11-30 MED ORDER — WITCH HAZEL-GLYCERIN EX PADS: 1.0000 "application " | MEDICATED_PAD | CUTANEOUS | Status: DC | PRN

## 2017-11-30 MED ORDER — SODIUM CHLORIDE 0.9% FLUSH: 3.0000 mL | INTRAVENOUS | Status: DC | PRN

## 2017-11-30 MED ORDER — ONDANSETRON HCL 4 MG/2ML IJ SOLN: 4.0000 mg | Freq: Three times a day (TID) | INTRAMUSCULAR | Status: DC | PRN

## 2017-11-30 MED ORDER — OXYCODONE HCL 5 MG PO TABS: 5.0000 mg | ORAL_TABLET | ORAL | Status: DC | PRN

## 2017-11-30 MED ORDER — OXYCODONE HCL 5 MG PO TABS: 5.0000 mg | ORAL_TABLET | Freq: Once | ORAL | Status: DC | PRN

## 2017-11-30 MED ORDER — SENNOSIDES-DOCUSATE SODIUM 8.6-50 MG PO TABS
2.0000 | ORAL_TABLET | ORAL | Status: DC
  Administered 2017-11-30 – 2017-12-02 (×2): 2 via ORAL
  Filled 2017-11-30 (×4): qty 2

## 2017-11-30 MED ORDER — DIBUCAINE 1 % RE OINT: 1.0000 "application " | TOPICAL_OINTMENT | RECTAL | Status: DC | PRN

## 2017-11-30 MED ORDER — NALOXONE HCL 4 MG/10ML IJ SOLN
1.0000 ug/kg/h | INTRAVENOUS | Status: DC | PRN
  Filled 2017-11-30: qty 5

## 2017-11-30 MED ORDER — NALOXONE HCL 0.4 MG/ML IJ SOLN: 0.4000 mg | INTRAMUSCULAR | Status: DC | PRN

## 2017-11-30 MED ORDER — DIPHENHYDRAMINE HCL 50 MG/ML IJ SOLN: 12.5000 mg | INTRAMUSCULAR | Status: DC | PRN

## 2017-11-30 MED ORDER — SIMETHICONE 80 MG PO CHEW
80.0000 mg | CHEWABLE_TABLET | Freq: Three times a day (TID) | ORAL | Status: DC
  Administered 2017-11-30 – 2017-12-02 (×6): 80 mg via ORAL
  Filled 2017-11-30 (×9): qty 1

## 2017-11-30 MED ORDER — FENTANYL CITRATE (PF) 100 MCG/2ML IJ SOLN
INTRAMUSCULAR | Status: AC
  Filled 2017-11-30: qty 2

## 2017-11-30 MED ORDER — OXYCODONE HCL 5 MG PO TABS: 10.0000 mg | ORAL_TABLET | ORAL | Status: DC | PRN

## 2017-11-30 MED ORDER — ACETAMINOPHEN 325 MG PO TABS: 650.0000 mg | ORAL_TABLET | ORAL | Status: DC | PRN

## 2017-11-30 MED ORDER — OXYTOCIN 40 UNITS IN LACTATED RINGERS INFUSION - SIMPLE MED: 2.5000 [IU]/h | INTRAVENOUS | Status: AC

## 2017-11-30 MED ORDER — ACETAMINOPHEN 160 MG/5ML PO SOLN: 325.0000 mg | ORAL | Status: DC | PRN

## 2017-11-30 MED ORDER — COCONUT OIL OIL
1.0000 "application " | TOPICAL_OIL | Status: DC | PRN
  Filled 2017-11-30: qty 120

## 2017-11-30 MED ORDER — ONDANSETRON HCL 4 MG/2ML IJ SOLN: 4.0000 mg | Freq: Once | INTRAMUSCULAR | Status: DC | PRN

## 2017-11-30 MED ORDER — DIPHENHYDRAMINE HCL 25 MG PO CAPS
25.0000 mg | ORAL_CAPSULE | ORAL | Status: DC | PRN
  Filled 2017-11-30: qty 1

## 2017-11-30 MED ORDER — FENTANYL CITRATE (PF) 100 MCG/2ML IJ SOLN
25.0000 ug | INTRAMUSCULAR | Status: DC | PRN
  Administered 2017-11-30: 25 ug via INTRAVENOUS

## 2017-11-30 MED ORDER — TETANUS-DIPHTH-ACELL PERTUSSIS 5-2.5-18.5 LF-MCG/0.5 IM SUSP: 0.5000 mL | Freq: Once | INTRAMUSCULAR | Status: DC

## 2017-11-30 MED ORDER — DIPHENHYDRAMINE HCL 25 MG PO CAPS: 25.0000 mg | ORAL_CAPSULE | ORAL | Status: DC | PRN

## 2017-11-30 MED ORDER — SCOPOLAMINE 1 MG/3DAYS TD PT72: 1.0000 | MEDICATED_PATCH | Freq: Once | TRANSDERMAL | Status: DC

## 2017-11-30 MED ORDER — PRENATAL MULTIVITAMIN CH
1.0000 | ORAL_TABLET | Freq: Every day | ORAL | Status: DC
  Administered 2017-12-02: 1 via ORAL
  Filled 2017-11-30 (×2): qty 1

## 2017-11-30 MED ORDER — LACTATED RINGERS IV SOLN
INTRAVENOUS | Status: DC
  Administered 2017-11-30 (×2): via INTRAVENOUS

## 2017-11-30 MED ORDER — DIPHENHYDRAMINE HCL 25 MG PO CAPS
25.0000 mg | ORAL_CAPSULE | Freq: Four times a day (QID) | ORAL | Status: DC | PRN
  Filled 2017-11-30: qty 1

## 2017-11-30 MED ORDER — SIMETHICONE 80 MG PO CHEW: 80.0000 mg | CHEWABLE_TABLET | ORAL | Status: DC | PRN

## 2017-11-30 MED ORDER — MENTHOL 3 MG MT LOZG: 1.0000 | LOZENGE | OROMUCOSAL | Status: DC | PRN

## 2017-11-30 MED ORDER — OXYCODONE HCL 5 MG/5ML PO SOLN: 5.0000 mg | Freq: Once | ORAL | Status: DC | PRN

## 2017-11-30 MED ORDER — ACETAMINOPHEN 325 MG PO TABS: 325.0000 mg | ORAL_TABLET | ORAL | Status: DC | PRN

## 2017-11-30 MED ORDER — SIMETHICONE 80 MG PO CHEW
80.0000 mg | CHEWABLE_TABLET | ORAL | Status: DC
  Administered 2017-11-30 – 2017-12-02 (×2): 80 mg via ORAL
  Filled 2017-11-30 (×2): qty 1

## 2017-11-30 MED ORDER — SCOPOLAMINE 1 MG/3DAYS TD PT72
1.0000 | MEDICATED_PATCH | Freq: Once | TRANSDERMAL | Status: DC
  Filled 2017-11-30: qty 1

## 2017-11-30 MED ORDER — SODIUM CHLORIDE 0.9% FLUSH
3.0000 mL | INTRAVENOUS | Status: DC | PRN
Start: 2017-11-30 — End: 2017-12-02

## 2017-11-30 MED ORDER — IBUPROFEN 600 MG PO TABS
600.0000 mg | ORAL_TABLET | Freq: Four times a day (QID) | ORAL | Status: DC
  Administered 2017-11-30 – 2017-12-02 (×9): 600 mg via ORAL
  Filled 2017-11-30 (×10): qty 1

## 2017-11-30 MED ORDER — KETOROLAC TROMETHAMINE 30 MG/ML IJ SOLN
30.0000 mg | Freq: Once | INTRAMUSCULAR | Status: DC | PRN
  Administered 2017-11-30: 30 mg via INTRAVENOUS

## 2017-11-30 MED ORDER — KETOROLAC TROMETHAMINE 30 MG/ML IJ SOLN
INTRAMUSCULAR | Status: AC
  Filled 2017-11-30: qty 1

## 2017-11-30 NOTE — Progress Notes (Signed)
Subjective: Postpartum Day 1: Cesarean Delivery Patient reports pain controlled, fatigue, no nausea or vomiting.  Foley catheter still in place  Objective: Vital signs in last 24 hours: Temp:  [97.8 F (36.6 C)-101.3 F (38.5 C)] 99.5 F (37.5 C) (01/31 0742) Pulse Rate:  [58-113] 84 (01/31 0747) Resp:  [14-24] 18 (01/31 0742) BP: (97-155)/(54-99) 104/71 (01/31 0747) SpO2:  [94 %-97 %] 97 % (01/31 0745)  Physical Exam:  General: alert, cooperative and appears stated age Lochia: appropriate Uterine Fundus: firm Incision: healing well DVT Evaluation: No evidence of DVT seen on physical exam.  Recent Labs    11/30/17 0017 11/30/17 0541  HGB 11.3* 11.1*  HCT 33.0* 32.4*    Assessment/Plan: Status post Cesarean section. Doing well postoperatively.  Continue current care. Desires neonatal circumcision, R/B/A of procedure discussed at length. Pt understands that neonatal circumcision is not considered medically necessary and is elective. The risks include, but are not limited to bleeding, infection, damage to the penis, development of scar tissue, and having to have it redone at a later date. Pt understands theses risks and wishes to proceed. Likely will proceed tomorrow at earliest  Julie [name] 11/30/2017, 9:56 AM

## 2017-11-30 NOTE — Addendum Note (Signed)
Addendum  created 11/30/17 0009 by Bethena Midgetddono, Odarius Dines, MD   Order list changed, Order sets accessed

## 2017-11-30 NOTE — Lactation Note (Signed)
This note was copied from a baby's chart. Lactation Consultation Note  Patient Name: Julie [name] HYQMV'HToday's Date: 11/30/2017 Reason for consult: Initial assessment;Primapara;Early term 37-38.6wks Breastfeeding consultation services and support information given and reviewed. Newborn is 6312 hours old and has been to breast twice with a nipple shield.  Mom has short nipples and RN has initiated a shield and pumping with symphony pump.  Instructed to feed with any feeding cue.  Baby rooting so assisted with positioning baby on left breast in football hold.  Baby unable to grasp breast so 20 mm nipple shield applied. Baby latched well and fed actively.  Mom c/o pain towards the end of feeding.  Nipple round when baby came off and colostrum in shield.  Encouraged to call for assist prn.  Maternal Data Has patient been taught Hand Expression?: Yes Does the patient have breastfeeding experience prior to this delivery?: No  Feeding Feeding Type: Breast Fed Length of feed: 1 min  LATCH Score Latch: Grasps breast easily, tongue down, lips flanged, rhythmical sucking.(with 20 mm nipple shield)  Audible Swallowing: A few with stimulation  Type of Nipple: Flat  Comfort (Breast/Nipple): Soft / non-tender  Hold (Positioning): Assistance needed to correctly position infant at breast and maintain latch.  LATCH Score: 7  Interventions Interventions: Breast feeding basics reviewed;Assisted with latch;Breast compression;Skin to skin;Adjust position;Breast massage;Support pillows;Hand express  Lactation Tools Discussed/Used Tools: Nipple Shields Nipple shield size: 20 Shell Type: Inverted Breast pump type: Double-Electric Breast Pump Pump Review: Setup, frequency, and cleaning Initiated by:: Milagros Reaponna Esker, RN Date initiated:: 11/30/17   Consult Status Consult Status: Follow-up Date: 12/02/19 Follow-up type: In-patient    Huston FoleyMOULDEN, Chakita Mcgraw S 11/30/2017, 11:53 AM

## 2017-11-30 NOTE — Addendum Note (Signed)
Addendum  created 11/30/17 1017 by Shanon PayorGregory, Tekeshia Klahr M, CRNA   Sign clinical note

## 2017-11-30 NOTE — Anesthesia Postprocedure Evaluation (Signed)
Anesthesia Post Note  Patient: Julie NullElizabeth [name]  Procedure(s) Performed: CESAREAN SECTION (N/A )     Patient location during evaluation: Mother Baby Anesthesia Type: Epidural Level of consciousness: awake and alert Pain management: pain level controlled Vital Signs Assessment: post-procedure vital signs reviewed and stable Respiratory status: spontaneous breathing, nonlabored ventilation and respiratory function stable Cardiovascular status: stable Postop Assessment: no headache, no backache and epidural receding Anesthetic complications: no    Last Vitals:  Vitals:   11/29/17 2350 11/29/17 2354  BP: (!) 188/75   Pulse: 100 92  Resp: 20 (!) 23  Temp: 36.9 C   SpO2:  97%    Last Pain:  Vitals:   11/29/17 2350  TempSrc:   PainSc: 1    Pain Goal:                 Fate Caster

## 2017-11-30 NOTE — Anesthesia Postprocedure Evaluation (Signed)
Anesthesia Post Note  Patient: Julie [name]  Procedure(s) Performed: CESAREAN SECTION (N/A )     Patient location during evaluation: Mother Baby Anesthesia Type: Epidural Level of consciousness: awake and alert and oriented Pain management: pain level controlled Vital Signs Assessment: post-procedure vital signs reviewed and stable Respiratory status: spontaneous breathing and nonlabored ventilation Cardiovascular status: stable Postop Assessment: no headache, no backache, patient able to bend at knees, no signs of nausea or vomiting and adequate PO intake Anesthetic complications: no    Last Vitals:  Vitals:   11/30/17 0745 11/30/17 0747  BP: 119/71 104/71  Pulse: 84 84  Resp:    Temp:    SpO2: 97%     Last Pain:  Vitals:   11/30/17 0747  TempSrc:   PainSc: 2    Pain Goal: Patients Stated Pain Goal: 4 (11/30/17 0736)               [name],[name]

## 2017-12-01 ENCOUNTER — Other Ambulatory Visit: Payer: Self-pay

## 2017-12-01 ENCOUNTER — Encounter (HOSPITAL_COMMUNITY): Payer: Self-pay | Admitting: Obstetrics

## 2017-12-01 NOTE — Progress Notes (Signed)
POD#2 Pt without complaints. States baby is having a lot of trouble "spitting up". Not ready for circ VSSAF [id number] IMP/ Stable Plan/ Routine care.

## 2017-12-02 MED ORDER — IBUPROFEN 600 MG PO TABS: 600.0000 mg | ORAL_TABLET | Freq: Four times a day (QID) | ORAL | 0 refills | Status: DC

## 2017-12-02 NOTE — Progress Notes (Addendum)
0150: Writer spoke to [employer] Nurs[employer] Charge Nurse regarding patient's discomfort with breast feeding her baby. Pt believes she is not producing adequate amount of milk to satisfy her baby's need. The baby is constantly sucking and cries when he is removed from the breast. I was encouraged to support and assist with breast feeding.  0630: Mom continues to breast fed her baby but Wilson SingerBaby Farrington is constantly on the breast. As soon as he is taken off he cries. It appears as if Wilson SingerBaby Uber has the need to suck or he is not getting enough breast milk. Mom sometimes gets frustrated and burst into tears because she is not sure if her baby is getting enough breast milk. The baby has a good latch and we can see and hear the sucking sound, additionally  the baby is stooling and voiding adequately.We will inform lactation in the morning as we continue to support and assist mom with breast feeding. I believe Liam GrahamBaby Jack need some nutritional supplement in addition to breast feeding until Mom start producing more milk.

## 2017-12-02 NOTE — Discharge Summary (Signed)
Obstetric Discharge Summary Reason for Admission: induction of labor Prenatal Procedures: NST and ultrasound Intrapartum Procedures: cesarean: low cervical, transverse Postpartum Procedures: none Complications-Operative and Postpartum: none Hemoglobin  Date Value Ref Range Status  11/30/2017 11.1 (L) 12.0 - 15.0 g/dL Final   HCT  Date Value Ref Range Status  11/30/2017 32.4 (L) 36.0 - 46.0 % Final    Physical Exam:  General: alert Lochia: appropriate Uterine Fundus: firm Incision: healing well DVT Evaluation: No evidence of DVT seen on physical exam.  Discharge Diagnoses: GHTN and arrest of descent  Discharge Information: Date: 12/02/2017 Activity: pelvic rest Diet: routine Medications: PNV and Ibuprofen Condition: stable Instructions: refer to practice specific booklet Discharge to: home Follow-up Information    Marlow Baarslark, Dyanna, MD. Schedule an appointment as soon as possible for a visit in 1 month(s).   Specialty:  Obstetrics Contact information: [address] [place] [id number] [phone]           Newborn Data: Live born female  Birth Weight: 6 lb 2.8 oz (2800 g) APGAR: 5, 8  Newborn Delivery   Birth date/time:  11/29/2017 22:55:00 Delivery type:  C-Section, Low Transverse C-section categorization:  Primary     Home with mother.  Anaiza Behrens E 12/02/2017, 8:16 AM

## 2017-12-02 NOTE — Lactation Note (Signed)
This note was copied from a baby's chart. Lactation Consultation Note  Patient Name: Julie Justine Nulllizabeth Tiso JJOAC'ZToday's Date: 12/02/2017 Reason for consult: Follow-up assessment;Infant weight loss;Primapara;1st time breastfeeding;Difficult latch(10 % weightloss, Bili check - at 49 hours - 1.9 / use of NS , DEBP less than 6 pounds )  Baby is 5258 hours old with significant weight loss  LC spoke with Julie BuddyJenny Riddle NP - regarding breast feeding findings and the need to supplement.  NP in agreement of LC plan and plans to write the order to supplement after every feeding with formula  Until EBM available.  Due to the baby's weight loss/ and challenges latch.  LC discussed with mom and dad the potential feeding challenges with 10 % weight loss and being an  Early term infant. Breast feeding goals - keep the weight loss at bay, and increase weight steadily,  Establish milk supply and protect it by increasing consistent post pumping for EBM to work with for  Supplementing. In the mean time having to use formula until milk comes in.  Lead discussed and reviewed reasons for supplementing medically.  LC encouraged  Mom and dad to [facility] so feeding can  Be assessed. Julie [name] the Diplomatic Services operational officersecretary.   Mom noted to be exhausted and teary with discussion.     Maternal Data    Feeding Feeding Type: (baby latched with #16 NS , not sucking when LC obs ) Length of feed: 45 min([facility] the baby was latched and on/ off sucking )  LATCH Score Latch: (latched with depth #16 NS )  Audible Swallowing: (per mom , no swallows noted by LC )  Type of Nipple: (when baby released- the #16 NS snug )  Comfort (Breast/Nipple): (per mom comfortable )  Hold (Positioning): (dad helped )     Interventions Interventions: Breast feeding basics reviewed  Lactation Tools Discussed/Used Tools: Pump(pe rmom has only pumped x 3 in the last 24 hours , scant results ) Breast pump  type: Double-Electric Breast Pump   Consult Status Consult Status: Follow-up(mom enc to call for next feeding ) Date: 12/02/17 Follow-up type: In-patient    Matilde SprangMargaret Ann Egidio Lofgren 12/02/2017, 9:37 AM

## 2017-12-02 NOTE — Lactation Note (Signed)
This note was copied from a baby's chart. Lactation Consultation Note  Patient Name: Julie [name]: 12/02/2017 Reason for consult: Follow-up assessment;Infant weight loss;Primapara;1st time breastfeeding  Baby is 6760 hours old , 2nd LC visit this am, parents called . Baby hungry and awake.  Wet and mec stool diaper changed by dad  LC assisted mom and resized her for NS , and the #20 NS a better fit.  @ 1st tried the 21F SNS under the NS and it was to snug, so to prevent skin breakdown,  Latched the baby with #20 NS, and  Instilled formula in the top for appetizer, and 21F SNS on the Side of the NS in the corner of the baby's mouth and depth sustained.  Baby fed for 19 mins with 25 ml of supplement. Per mom latch was comfortable, and the baby  Was feeding more consistently compared to other feedings.  Supplementing guide lines provided( LPT guide lines given due to baby being a ET infant ) , Less than 6 pounds  Now and 10 % weight loss.  After the 1st syringe completed , LC pull out tube so LC instructed dad how to insert the 21F SNS in the corner  Of the mouth and dad did great. Also showed dad how to clean it .  LC reviewed LC plan  Feed the baby STS 1st breast every 2-3 hours with #20 NS ( appetizer instilled in the NS)  Dad to insert the 21F SNS in the corner of the mouth to supplement with EBM 1st , formula 2nd .  Supplement up to 30 ml  Feed for 15 - 20 mins , and post pump both breast for 15 -20 mins  If the 21F SNS is not working - Bottle nipple to be used due to increasing weight steadily.   LC reassured mom and dad that breast feeding will work it self out.  LC encouraged rest and nap for for both.      Maternal Data Has patient been taught Hand Expression?: Yes  Feeding Feeding Type: Breast Milk with Formula added Length of feed: 19 min  LATCH Score Latch: Grasps breast easily, tongue down, lips flanged, rhythmical sucking.  Audible Swallowing:  Spontaneous and intermittent  Type of Nipple: Flat  Comfort (Breast/Nipple): Soft / non-tender  Hold (Positioning): Assistance needed to correctly position infant at breast and maintain latch.  LATCH Score: 8  Interventions Interventions: Breast feeding basics reviewed;Assisted with latch;Skin to skin;Breast massage;Hand express;Breast compression;Adjust position;Support pillows;Position options;Expressed milk;Shells;Hand pump;DEBP  Lactation Tools Discussed/Used Tools: Shells;Pump;21F feeding tube / Syringe;Other (comment)(21F SNS with feeding ) Nipple shield size: 20 Shell Type: Inverted Breast pump type: Double-Electric Breast Pump   Consult Status Consult Status: Follow-up [name]: 12/03/17 Follow-up type: In-patient    Julie SprangMargaret Ann Aalijah [name] 12/02/2017, 10:57 AM

## 2017-12-02 NOTE — Progress Notes (Signed)
POD#3 Pt without complaints. Breastfeeding improving.  VSSAF IMP/stable Plan/ Discharge

## 2017-12-03 ENCOUNTER — Ambulatory Visit: Payer: Self-pay

## 2017-12-03 NOTE — Lactation Note (Signed)
This note was copied from a baby's chart. Lactation Consultation Note  Patient Name: Julie [name] WJXBJ'YToday's Date: 12/03/2017 Reason for consult: Follow-up assessment;Infant weight loss;Primapara;1st time breastfeeding;Infant < 6lbs;Other (Comment)(baby has gained since yesterday )  As LC entered the room , baby latched on the right breast with #20 NS and dad helping with 36F SNS,  Over the NS. Baby fed 1st breast 10 mins.  LC discussed with mom and dad since her milk is coming in to feed the baby 15 -20 mins on the 1st breast with the  Supplementing SNS 25 -30 ml.  Mom encouraged to wear her shells between feedings to elongate the nipple / areola complex.  Sore nipple and engorgement prevention and tx reviewed.  Mom aware after feeding to post pump after 5-6 feedings a day both breast for 10 -15 mins , save milk to supplement back to baby.  Baby needs to feed every 3 hours and with feeding cues , if the baby won't latch need to feed EBM or formula from a bottle at least 30 ml.  Mom [facility] dad [facility], request placed in box.  Mom has DEBP at home. Mother informed of post-discharge support and given phone number to the lactation department, including services for phone call assistance; out-patient appointments; and breastfeeding support group. List of other breastfeeding resources in the community given in the handout. Encouraged mother to call for problems or concerns related to breastfeeding.     Maternal Data Has patient been taught Hand Expression?: Yes Does the patient have breastfeeding experience prior to this delivery?: No  Feeding Feeding Type: Breast Fed Length of feed: 10 min(per mom )  LATCH Score                   Interventions Interventions: Breast feeding basics reviewed  Lactation Tools Discussed/Used Tools: Nipple Shields Nipple shield size: 20 Shell Type: Inverted Breast pump type: Double-Electric Breast  Pump   Consult Status Consult Status: Follow-up Date: [facility] ) Follow-up type: Other (comment)(mom aware the clinic will call her )    Kathrin GreathouseMargaret Ann Briteny Fulghum 12/03/2017, 2:51 PM

## 2017-12-05 NOTE — Op Note (Signed)
Cesarean Section Procedure Note  PATIENT:  Julie [name]  32 y.o. female  PRE-OPERATIVE DIAGNOSIS:  Arrest of Descent, gestational hypertension  POST-OPERATIVE DIAGNOSIS:  Arrest of Descent, gestational hypertension  PROCEDURE:  Procedure(s): CESAREAN SECTION (N/A)  SURGEON:  Surgeon(s) and Role:    Marlow Baars* Elkin Belfield, MD - Primary    * Anyanwu, Jethro BastosUgonna A, MD - Assisting  ANESTHESIA:   epidural  EBL:  426 mL   BLOOD ADMINISTERED:none  SPECIMEN:  Source of Specimen:  placenta to L&D  FINDINGS:   Normal uterus, tubes and ovaries bilaterally.  Viable female infant, 2800g (6lb 3oz) Apgars 5, 8.    Procedure Details   After epidural anesthesia was found to adequate , the patient was placed in the dorsal supine position with a leftward tilt, draped and prepped in the usual sterile manner. A Pfannenstiel incision was made and carried down through the subcutaneous tissue to the fascia. The fascia was incised in the midline and the fascial incision was extended laterally with Mayo scissors. The superior aspect of the fascial incision was grasped with two Kocher clamp, tented up and the rectus muscles dissected off sharply. The rectus was then dissected off with blunt dissection and Mayo scissors inferiorly. The rectus muscles were separated in the midline. The abdominal peritoneum was identified, tented up, entered bluntly, and the incision was extended superiorly and inferiorly with good visualization of the bladder. The Alexis retractor was deployed. The vesicouterine peritoneum was identified, tented up, entered sharply, and the bladder flap was created digitally. The scalpel was then used to make a low transverse incision on the uterus which was extended in the cephalad-caudad direction with blunt dissection. The fluid was clear. The fetal head was wedged tightly into the pelvis in the direct occiput posterior position.  Significant effort was required to elevate the head to gently to the  hysterotomy after three hours of maternal pushing.  A second attending was called to assist only with delivery of the impacted fetal head.  After the fetal vertex was elevated out of the pelvis and brought to the hysterotomy, the shoulders and body were easily delivered. The cord was clamped and cut and the infant was passed to the waiting neonatologist. Placenta was then delivered spontaneously, intact and appear normal, the uterus was cleared of all clot and debris   The hysterotomy was repaired with #0 Monocryl in running locked fashion.  A second imbricating layer of #0 Monocryl was placed. The hysterotomy was reexamined and excellent hemostasis was noted.  The Alexis retractor was removed from the abdomen. The peritoneum was examined and all vessels noted to be hemostatic. The abdominal cavity was cleared of all clot and debris.  The peritoneum was closed with 2-0 vicryl in a running fashion. The fascia and rectus muscles were inspected and were hemostatic. The fascia was closed with 0 Vicryl in a running fashion. The subcuticular layer was irrigated and all bleeders cauterized.  The subcutaneous layer was re approximated with interrupted 3-0 plain gut.  The skin was closed with 3-0 monocryl in a subcuticular fashion. The incision was dressed with benzoine, steri strips and honeycomb dressing. All sponge lap and needle counts were correct x3. Patient tolerated the procedure well and recovered in stable condition following the procedure.

## 2020-07-11 DIAGNOSIS — Z8616 Personal history of COVID-19: Secondary | ICD-10-CM

## 2020-07-11 HISTORY — DX: Personal history of COVID-19: Z86.16

## 2021-01-21 ENCOUNTER — Other Ambulatory Visit (HOSPITAL_COMMUNITY): Payer: Self-pay | Admitting: Obstetrics and Gynecology

## 2021-01-21 ENCOUNTER — Other Ambulatory Visit: Payer: Self-pay

## 2021-01-21 ENCOUNTER — Encounter (HOSPITAL_BASED_OUTPATIENT_CLINIC_OR_DEPARTMENT_OTHER): Payer: Self-pay | Admitting: Obstetrics and Gynecology

## 2021-01-21 ENCOUNTER — Other Ambulatory Visit: Payer: Self-pay | Admitting: Obstetrics and Gynecology

## 2021-01-21 ENCOUNTER — Other Ambulatory Visit (HOSPITAL_COMMUNITY)
Admission: RE | Admit: 2021-01-21 | Discharge: 2021-01-21 | Disposition: A | Discharge status: Home / Self Care | Payer: BC Managed Care – PPO | Source: Ambulatory Visit | Attending: Obstetrics and Gynecology | Admitting: Obstetrics and Gynecology

## 2021-01-21 DIAGNOSIS — Z20822 Contact with and (suspected) exposure to covid-19: Secondary | ICD-10-CM | POA: Insufficient documentation

## 2021-01-21 DIAGNOSIS — Z01812 Encounter for preprocedural laboratory examination: Secondary | ICD-10-CM | POA: Insufficient documentation

## 2021-01-21 DIAGNOSIS — Z7984 Long term (current) use of oral hypoglycemic drugs: Secondary | ICD-10-CM | POA: Diagnosis not present

## 2021-01-21 DIAGNOSIS — O021 Missed abortion: Secondary | ICD-10-CM | POA: Diagnosis not present

## 2021-01-21 DIAGNOSIS — Z8616 Personal history of COVID-19: Secondary | ICD-10-CM | POA: Diagnosis not present

## 2021-01-21 LAB — SARS CORONAVIRUS 2 (TAT 6-24 HRS): SARS Coronavirus 2: NEGATIVE

## 2021-01-21 NOTE — Progress Notes (Signed)
Spoke w/ via phone for pre-op interview--- PT Lab needs dos---- CBC, T&S (orders need second sign)              Lab results------ no COVID test ------ 01-21-2021 @ 1200 Arrive at ------- 0730 on 01-22-2021 NPO after MN NO Solid Food.  Clear liquids from MN until--- 0630 Med rec completed Medications to take morning of surgery ----- NONE Diabetic medication ----- n/a Patient instructed to bring photo id and insurance card day of surgery Patient aware to have Driver (ride ) / caregiver    for 24 hours after surgery --- husband, Julie [name] Patient Special Instructions ----- n/a Pre-Op special Istructions ----- case just added on today Patient verbalized understanding of instructions that were given at this phone interview. Patient denies shortness of breath, chest pain, fever, cough at this phone interview.

## 2021-01-22 ENCOUNTER — Ambulatory Visit (HOSPITAL_COMMUNITY)
Admission: RE | Admit: 2021-01-22 | Discharge: 2021-01-22 | Disposition: A | Discharge status: Home / Self Care | Payer: BC Managed Care – PPO | Source: Ambulatory Visit | Attending: Obstetrics and Gynecology | Admitting: Obstetrics and Gynecology

## 2021-01-22 ENCOUNTER — Ambulatory Visit (HOSPITAL_BASED_OUTPATIENT_CLINIC_OR_DEPARTMENT_OTHER): Payer: BC Managed Care – PPO | Admitting: Anesthesiology

## 2021-01-22 ENCOUNTER — Encounter (HOSPITAL_BASED_OUTPATIENT_CLINIC_OR_DEPARTMENT_OTHER)
Admission: RE | Disposition: A | Discharge status: Home / Self Care | Payer: Self-pay | Source: Ambulatory Visit | Attending: Obstetrics and Gynecology

## 2021-01-22 ENCOUNTER — Encounter (HOSPITAL_BASED_OUTPATIENT_CLINIC_OR_DEPARTMENT_OTHER): Payer: Self-pay | Admitting: Obstetrics and Gynecology

## 2021-01-22 ENCOUNTER — Ambulatory Visit (HOSPITAL_BASED_OUTPATIENT_CLINIC_OR_DEPARTMENT_OTHER)
Admission: RE | Admit: 2021-01-22 | Discharge: 2021-01-22 | Disposition: A | Discharge status: Home / Self Care | Payer: BC Managed Care – PPO | Source: Ambulatory Visit | Attending: Obstetrics and Gynecology | Admitting: Obstetrics and Gynecology

## 2021-01-22 ENCOUNTER — Other Ambulatory Visit: Payer: Self-pay

## 2021-01-22 DIAGNOSIS — Z8616 Personal history of COVID-19: Secondary | ICD-10-CM | POA: Insufficient documentation

## 2021-01-22 DIAGNOSIS — O021 Missed abortion: Secondary | ICD-10-CM | POA: Diagnosis not present

## 2021-01-22 DIAGNOSIS — Z7984 Long term (current) use of oral hypoglycemic drugs: Secondary | ICD-10-CM | POA: Insufficient documentation

## 2021-01-22 HISTORY — DX: Personal history of other complications of pregnancy, childbirth and the puerperium: Z87.59

## 2021-01-22 HISTORY — DX: Personal history of traumatic brain injury: Z87.820

## 2021-01-22 HISTORY — DX: Presence of spectacles and contact lenses: Z97.3

## 2021-01-22 HISTORY — PX: OPERATIVE ULTRASOUND: SHX5996

## 2021-01-22 HISTORY — DX: Missed abortion: O02.1

## 2021-01-22 HISTORY — DX: Polycystic ovarian syndrome: E28.2

## 2021-01-22 HISTORY — PX: DILATION AND EVACUATION: SHX1459

## 2021-01-22 LAB — CBC
HCT: 42.3 % (ref 36.0–46.0)
Hemoglobin: 14.2 g/dL (ref 12.0–15.0)
MCH: 30.3 pg (ref 26.0–34.0)
MCHC: 33.6 g/dL (ref 30.0–36.0)
MCV: 90.2 fL (ref 80.0–100.0)
Platelets: 392 10*3/uL (ref 150–400)
RBC: 4.69 MIL/uL (ref 3.87–5.11)
RDW: 12.8 % (ref 11.5–15.5)
WBC: 9.5 10*3/uL (ref 4.0–10.5)
nRBC: 0 % (ref 0.0–0.2)

## 2021-01-22 LAB — TYPE AND SCREEN
ABO/RH(D): A POS
Antibody Screen: NEGATIVE

## 2021-01-22 SURGERY — DILATION AND EVACUATION, UTERUS
Anesthesia: General | Site: Uterus

## 2021-01-22 MED ORDER — FENTANYL CITRATE (PF) 100 MCG/2ML IJ SOLN
INTRAMUSCULAR | Status: AC
  Filled 2021-01-22: qty 2

## 2021-01-22 MED ORDER — OXYCODONE HCL 5 MG PO TABS: 5.0000 mg | ORAL_TABLET | Freq: Once | ORAL | Status: DC | PRN

## 2021-01-22 MED ORDER — DEXAMETHASONE SODIUM PHOSPHATE 4 MG/ML IJ SOLN
INTRAMUSCULAR | Status: DC | PRN
  Administered 2021-01-22: 8 mg via INTRAVENOUS

## 2021-01-22 MED ORDER — FENTANYL CITRATE (PF) 100 MCG/2ML IJ SOLN
INTRAMUSCULAR | Status: DC | PRN
  Administered 2021-01-22: 100 ug via INTRAVENOUS

## 2021-01-22 MED ORDER — LACTATED RINGERS IV SOLN: INTRAVENOUS | Status: DC

## 2021-01-22 MED ORDER — KETOROLAC TROMETHAMINE 30 MG/ML IJ SOLN
INTRAMUSCULAR | Status: DC | PRN
  Administered 2021-01-22: 30 mg via INTRAVENOUS

## 2021-01-22 MED ORDER — FENTANYL CITRATE (PF) 100 MCG/2ML IJ SOLN: 25.0000 ug | INTRAMUSCULAR | Status: DC | PRN

## 2021-01-22 MED ORDER — AMISULPRIDE (ANTIEMETIC) 5 MG/2ML IV SOLN: 10.0000 mg | Freq: Once | INTRAVENOUS | Status: DC | PRN

## 2021-01-22 MED ORDER — MIDAZOLAM HCL 2 MG/2ML IJ SOLN
INTRAMUSCULAR | Status: AC
  Filled 2021-01-22: qty 2

## 2021-01-22 MED ORDER — BUPIVACAINE HCL (PF) 0.25 % IJ SOLN
INTRAMUSCULAR | Status: DC | PRN
  Administered 2021-01-22: 7 mL

## 2021-01-22 MED ORDER — POVIDONE-IODINE 10 % EX SWAB: 2.0000 "application " | Freq: Once | CUTANEOUS | Status: DC

## 2021-01-22 MED ORDER — MEPERIDINE HCL 25 MG/ML IJ SOLN: 6.2500 mg | INTRAMUSCULAR | Status: DC | PRN

## 2021-01-22 MED ORDER — OXYCODONE HCL 5 MG/5ML PO SOLN: 5.0000 mg | Freq: Once | ORAL | Status: DC | PRN

## 2021-01-22 MED ORDER — ONDANSETRON HCL 4 MG/2ML IJ SOLN
INTRAMUSCULAR | Status: DC | PRN
  Administered 2021-01-22: 4 mg via INTRAVENOUS

## 2021-01-22 MED ORDER — IBUPROFEN 800 MG PO TABS: 800.0000 mg | ORAL_TABLET | Freq: Three times a day (TID) | ORAL | 0 refills | Status: AC | PRN

## 2021-01-22 MED ORDER — ACETAMINOPHEN 500 MG PO TABS
ORAL_TABLET | ORAL | Status: AC
  Filled 2021-01-22: qty 1

## 2021-01-22 MED ORDER — KETOROLAC TROMETHAMINE 30 MG/ML IJ SOLN: 30.0000 mg | Freq: Once | INTRAMUSCULAR | Status: DC | PRN

## 2021-01-22 MED ORDER — ACETAMINOPHEN 500 MG PO TABS
1000.0000 mg | ORAL_TABLET | Freq: Once | ORAL | Status: AC
  Administered 2021-01-22: 1000 mg via ORAL
  Filled 2021-01-22: qty 2

## 2021-01-22 MED ORDER — DOXYCYCLINE HYCLATE 100 MG IV SOLR
200.0000 mg | Freq: Once | INTRAVENOUS | Status: AC
  Administered 2021-01-22: 200 mg via INTRAVENOUS
  Filled 2021-01-22: qty 200

## 2021-01-22 MED ORDER — PROPOFOL 10 MG/ML IV BOLUS
INTRAVENOUS | Status: DC | PRN
  Administered 2021-01-22: 200 mg via INTRAVENOUS

## 2021-01-22 MED ORDER — MIDAZOLAM HCL 2 MG/2ML IJ SOLN
INTRAMUSCULAR | Status: DC | PRN
  Administered 2021-01-22: 2 mg via INTRAVENOUS

## 2021-01-22 SURGICAL SUPPLY — 18 items
CATH ROBINSON RED A/P 16FR (CATHETERS) ×2 IMPLANT
DECANTER SPIKE VIAL GLASS SM (MISCELLANEOUS) ×2 IMPLANT
FILTER UTR ASPR ASSEMBLY (MISCELLANEOUS) ×2 IMPLANT
GLOVE SURG ENC MOIS LTX SZ7.5 (GLOVE) ×2 IMPLANT
GLOVE SURG UNDER POLY LF SZ7 (GLOVE) ×2 IMPLANT
GOWN STRL REUS W/TWL LRG LVL3 (GOWN DISPOSABLE) ×4 IMPLANT
KIT BERKELEY 1ST TRIMESTER 3/8 (MISCELLANEOUS) ×4 IMPLANT
KIT TURNOVER CYSTO (KITS) ×2 IMPLANT
NS IRRIG 1000ML POUR BTL (IV SOLUTION) ×2 IMPLANT
PACK VAGINAL MINOR WOMEN LF (CUSTOM PROCEDURE TRAY) ×2 IMPLANT
PAD OB MATERNITY 4.3X12.25 (PERSONAL CARE ITEMS) ×2 IMPLANT
SET BERKELEY SUCTION TUBING (SUCTIONS) ×2 IMPLANT
TOWEL OR 17X26 10 PK STRL BLUE (TOWEL DISPOSABLE) ×4 IMPLANT
UNDERPAD 30X36 HEAVY ABSORB (UNDERPADS AND DIAPERS) ×2 IMPLANT
VACURETTE 10 RIGID CVD (CANNULA) ×2 IMPLANT
VACURETTE 7MM CVD STRL WRAP (CANNULA) IMPLANT
VACURETTE 8 RIGID CVD (CANNULA) IMPLANT
VACURETTE 9 RIGID CVD (CANNULA) IMPLANT

## 2021-01-22 NOTE — H&P (Signed)
Julie [name] is an 35 y.o. female presenting for scheduled D&C for MAB.  Patientis a 34Y G3P1011 @ 12.0 LMP  measuring [redacted]w[redacted]d on TVUS on 01/19/21 with absent FHR. Patient had been thoroughly counseled on risks/benefits of expectant, medical, and surgical management and has elected for surgical management with D&C procedure. Patient and husband had repeat US performed this morning in preop to confirm absent of FHR. Patient does have history of 1 previous SAB, passed at home without interventions, and one previous full term pregnancy resulting in primary c/s for arrest of descent otherwise uncomplicated. Medical history significant for PCOS on Metformin. PCP had placed patient on progesterone supplementation for low progesterone levels. No other prescription medications or medical problems. Patient reports cramping but no VB today.     Past Medical History:  Diagnosis Date  . History of 2019 novel coronavirus disease (COVID-19) 07/11/2020   positive covid result in care everywhere,  per pt mild symptoms that resolved  . History of concussion    per pt had mild concussion without loc at age 16, no residual  . History of pregnancy induced hypertension   . Missed ab   . PCOS (polycystic ovarian syndrome)   . Wears glasses     Past Surgical History:  Procedure Laterality Date  . CESAREAN SECTION N/A 11/29/2017   Procedure: CESAREAN SECTION;  Surgeon: Marlow Baars, MD;  Location: [facility] BIRTHING SUITES;  Service: Obstetrics;  Laterality: N/A;  . KNEE ARTHROSCOPY W/ ACL RECONSTRUCTION Left 2007    Family History  Problem Relation Age of Onset  . Hypertension Father   . Heart disease Father   . Colon cancer Maternal Grandfather     Social History:  reports that she has never smoked. She has never used smokeless tobacco. She reports that she does not drink alcohol and does not use drugs.  Allergies:  Allergies  Allergen Reactions  . Latex Hives    Medications Prior to Admission  Medication  Sig Dispense Refill Last Dose  . metFORMIN (GLUCOPHAGE-XR) 500 MG 24 hr tablet Take 500 mg by mouth daily with breakfast.   01/21/2021 at Unknown time  . Prenatal Vit-Fe Fumarate-FA (PRENATAL MULTIVITAMIN) TABS tablet Take 1 tablet by mouth daily at 12 noon.   01/21/2021 at Unknown time  . progesterone (PROMETRIUM) 200 MG capsule Take 200 mg by mouth daily.   Past Week at Unknown time    Review of Systems  All other systems reviewed and are negative.   Blood pressure 127/85, pulse 73, temperature 98.1 F (36.7 C), temperature source Oral, resp. rate 17, height 5\' 4"  (1.626 m), weight 82.7 kg, last menstrual period 01/29/2020, SpO2 100 %, not currently breastfeeding. Physical Exam Vitals reviewed.  Constitutional:      Appearance: Normal appearance.  HENT:     Head: Normocephalic.  Cardiovascular:     Rate and Rhythm: Normal rate.  Pulmonary:     Effort: Pulmonary effort is normal.  Abdominal:     General: Abdomen is flat.  Musculoskeletal:     Cervical back: Normal range of motion.  Skin:    General: Skin is warm.  Neurological:     General: No focal deficit present.     Mental Status: She is alert.  Psychiatric:        Mood and Affect: Mood normal.     Results for orders placed or performed during the hospital encounter of 01/22/21 (from the past 24 hour(s))  Type and screen     Status: None (Preliminary  result)   Collection Time: 01/22/21  8:13 AM  Result Value Ref Range   ABO/RH(D) PENDING    Antibody Screen PENDING    Sample Expiration      01/25/2021,2359 Performed at [facility], [address], [place], [place] [address]   CBC     Status: None   Collection Time: 01/22/21  8:13 AM  Result Value Ref Range   WBC 9.5 4.0 - 10.5 K/uL   RBC 4.69 3.87 - 5.11 MIL/uL   Hemoglobin 14.2 12.0 - 15.0 g/dL   HCT 08.6 76.1 - 95.0 %   MCV 90.2 80.0 - 100.0 fL   MCH 30.3 26.0 - 34.0 pg   MCHC 33.6 30.0 - 36.0 g/dL   RDW 93.2 67.1 - 24.5 %   Platelets  392 150 - 400 K/uL   nRBC 0.0 0.0 - 0.2 %    No results found.  Assessment/Plan: 80D [id number] with MAB measuring [redacted]w[redacted]d with absent FHR, scheduled for suction D&C procedure   -Patient consented including review of risks of bleeding, infection, damage to surrounding structures, and uterine adhesions. Consents signed -Doxycycline 200mg  IV antibiotic ppx -SCD VTE ppx - guidance intraop -Routine intraop/postop care -Anticipate DC home today, DC instructions and call parameters given. Follow up in office scheduled in 2 weeks   Hodges Treiber A Ashaun Gaughan 01/22/2021, 9:27 AM

## 2021-01-22 NOTE — Transfer of Care (Signed)
Immediate Anesthesia Transfer of Care Note  Patient: Julie [name]  Procedure(s) Performed: DILATATION AND EVACUATION (N/A Uterus) OPERATIVE ULTRASOUND (N/A Uterus)  Patient Location: PACU  Anesthesia Type:General  Level of Consciousness: awake, alert , oriented and patient cooperative  Airway & Oxygen Therapy: Patient Spontanous Breathing  Post-op Assessment: Report given to RN and Post -op Vital signs reviewed and stable  Post vital signs: Reviewed and stable  Last Vitals:  Vitals Value Taken Time  BP 116/78 01/22/21 1017  Temp    Pulse 79 01/22/21 1019  Resp 12 01/22/21 1019  SpO2 100 % 01/22/21 1019  Vitals shown include unvalidated device data.  Last Pain:  Vitals:   01/22/21 0801  TempSrc: Oral         Complications: No complications documented.

## 2021-01-22 NOTE — Anesthesia Postprocedure Evaluation (Signed)
Anesthesia Post Note  Patient: Julie [name]  Procedure(s) Performed: DILATATION AND EVACUATION (N/A Uterus) OPERATIVE ULTRASOUND (N/A Uterus)     Patient location during evaluation: PACU Anesthesia Type: General Level of consciousness: sedated and patient cooperative Pain management: pain level controlled Vital Signs Assessment: post-procedure vital signs reviewed and stable Respiratory status: spontaneous breathing Cardiovascular status: stable Anesthetic complications: no   No complications documented.  Last Vitals:  Vitals:   01/22/21 1115 01/22/21 1200  BP: 111/70 117/71  Pulse: 60 81  Resp: 15 14  Temp:  37.3 C  SpO2: 99% 94%    Last Pain:  Vitals:   01/22/21 1200  TempSrc:   PainSc: 0-No pain                 Lewie Loron

## 2021-01-22 NOTE — Anesthesia Preprocedure Evaluation (Addendum)
Anesthesia Evaluation  Patient identified by MRN, date of birth, ID band Patient awake    Reviewed: Allergy & Precautions, NPO status , Patient's Chart, lab work & pertinent test results  Airway Mallampati: II  TM Distance: >3 FB Neck ROM: Full    Dental  (+) Dental Advisory Given, Teeth Intact   Pulmonary neg pulmonary ROS,    Pulmonary exam normal breath sounds clear to auscultation       Cardiovascular hypertension, Normal cardiovascular exam Rhythm:Regular Rate:Normal     Neuro/Psych negative neurological ROS     GI/Hepatic negative GI ROS, Neg liver ROS,   Endo/Other  negative endocrine ROS  Renal/GU negative Renal ROS     Musculoskeletal negative musculoskeletal ROS (+)   Abdominal (+) + obese,   Peds  Hematology negative hematology ROS (+)   Anesthesia Other Findings   Reproductive/Obstetrics                            Anesthesia Physical Anesthesia Plan  ASA: II  Anesthesia Plan: General   Post-op Pain Management:    Induction: Intravenous  PONV Risk Score and Plan: 4 or greater and Ondansetron, Dexamethasone, Midazolam, Diphenhydramine and Treatment may vary due to age or medical condition  Airway Management Planned: LMA  Additional Equipment: None  Intra-op Plan:   Post-operative Plan: Extubation in OR  Informed Consent: I have reviewed the patients History and Physical, chart, labs and discussed the procedure including the risks, benefits and alternatives for the proposed anesthesia with the patient or authorized representative who has indicated his/her understanding and acceptance.     Dental advisory given  Plan Discussed with: CRNA  Anesthesia Plan Comments:        Anesthesia Quick Evaluation

## 2021-01-22 NOTE — Discharge Instructions (Signed)

## 2021-01-22 NOTE — Anesthesia Procedure Notes (Signed)
Procedure Name: LMA Insertion Date/Time: 01/22/2021 9:45 AM Performed by: Earmon Phoenix, CRNA Pre-anesthesia Checklist: Patient identified, Emergency Drugs available, Suction available, Patient being monitored and Timeout performed Patient Re-evaluated:Patient Re-evaluated prior to induction Oxygen Delivery Method: Circle system utilized Preoxygenation: Pre-oxygenation with 100% oxygen Induction Type: IV induction Ventilation: Mask ventilation without difficulty LMA: LMA inserted LMA Size: 4.0 Number of attempts: 1 Placement Confirmation: positive ETCO2,  CO2 detector and breath sounds checked- equal and bilateral Tube secured with: Tape Dental Injury: Teeth and Oropharynx as per pre-operative assessment

## 2021-01-22 NOTE — Op Note (Signed)
Julie [name] Sep 06, 1986 [id number]  Operative Note  PROCEDURE: dilation and evacuation  PRE-OPERATIVE DIAGNOSIS: missed abortion measuring 10 weeks 6 days absence of fetal heart rate  POST-OPERATIVE DIAGNOSIS: missed abortion measuring 10 weeks 6 days absence of fetal heart rate  SURGEON: Dr. Clance Boll, DO  ASSISTANT: N/A  FINDINGS: normal appearing external female genitalia, ectocervix normal without lesions, cervix dilated to a 31 french without difficulties, bedside TAUS performed throughout procedure with completion ES measuring 0.87 cm  SPECIMENS: products of conception  EBL: 20 cc  FLUIDS: per anesthesia  MEDICATIONS: Doxycycline 200 mg IV  COMPLICATIONS: None  PROCEDURE IN DETAIL:   After the patient was appropriately consented in the holding area, she was taken to the operating room where general anesthesia was administered without complications. The patient was placed in the dorsal lithotomy position. The patient was prepped and draped in the usual sterile fashion. An appropriate time out was performed that verified the correct patient, procedure, and surgical team.   A sterile speculum was inserted into the vagina and the cervix was visualized. A single tooth tenaculum was used to grasp the anterior lip of the cervix. A paracervical block was obtained using .025% Marcaine injecting a total of 10 cc divided between the 4 and 8 o'clock positions. The cervix was easily dilated to a size 31 french without difficulties. A size 10 mm suction curette was gently advanced to the fundus and connected to the suction tubing. Once the appropriate pressure was obtained the curette was rotated clockwise and slowly withdrawn from the uterine cavity. Specimens within the tubing appeared consistent with products of conception. Three passes were made followed by a gentle sharp curetting and two more passes with the suction curette. On the final pass only blood was noted within the tubing  and bedside TAUS confirmed thin endometrial stripe. All specimens were sent together for final pathology. The tenaculum was removed and excellent hemostasis was appreciated. Speculum was removed. Patient tolerated the procedure well and was taken to the recovery room in stable condition. All instrument and lap counts were correct. Patient blood type A POS.  [name]  01/22/21 10:21 AM

## 2021-01-25 ENCOUNTER — Encounter (HOSPITAL_BASED_OUTPATIENT_CLINIC_OR_DEPARTMENT_OTHER): Payer: Self-pay | Admitting: Obstetrics and Gynecology

## 2021-01-25 LAB — SURGICAL PATHOLOGY

## 2022-05-24 IMAGING — US US PELVIS COMPLETE
1 series · 14 of 25 positions shown · non-contrast
Comparison: None available.

CLINICAL DATA: Initial preoperative evaluation for missed abortion,
scheduled for D & E later today.

EXAM:
TRANSABDOMINAL ULTRASOUND OF PELVIS
TECHNIQUE: Transabdominal ultrasound examination of the pelvis was performed
including evaluation of the uterus, ovaries, adnexal regions, and
pelvic cul-de-sac.

[Series 1: us pelvis complete · 122 acquisitions, 14 frames shown]
[im 1/122]
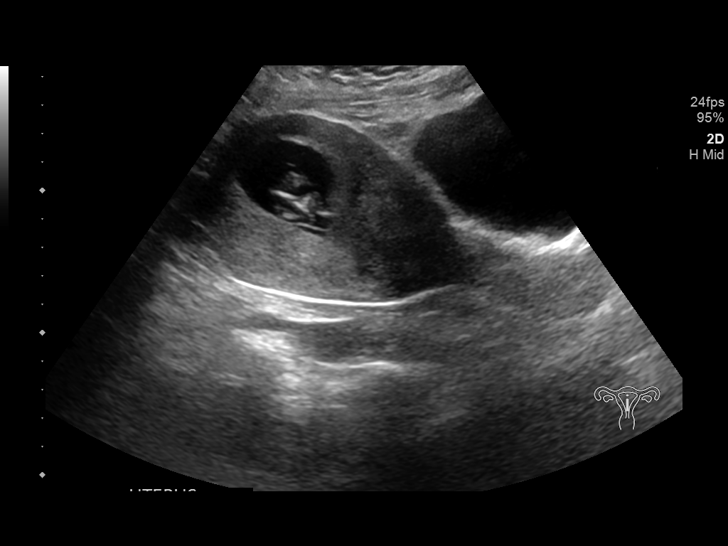
[im 11/122]
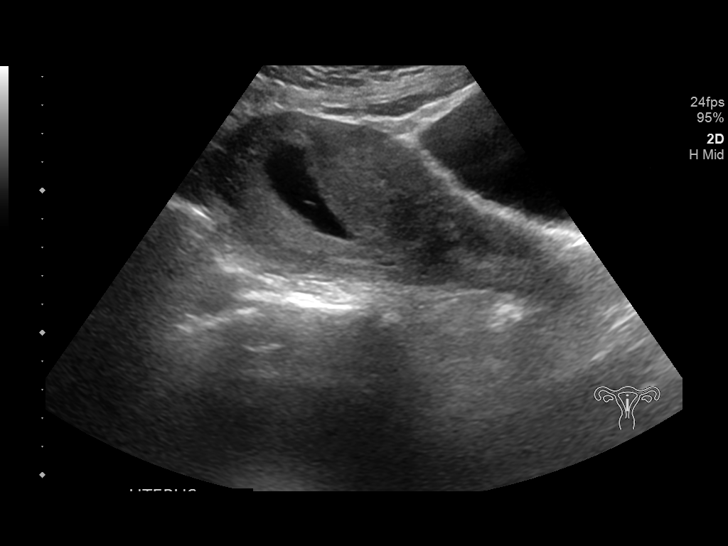
[im 21/122]
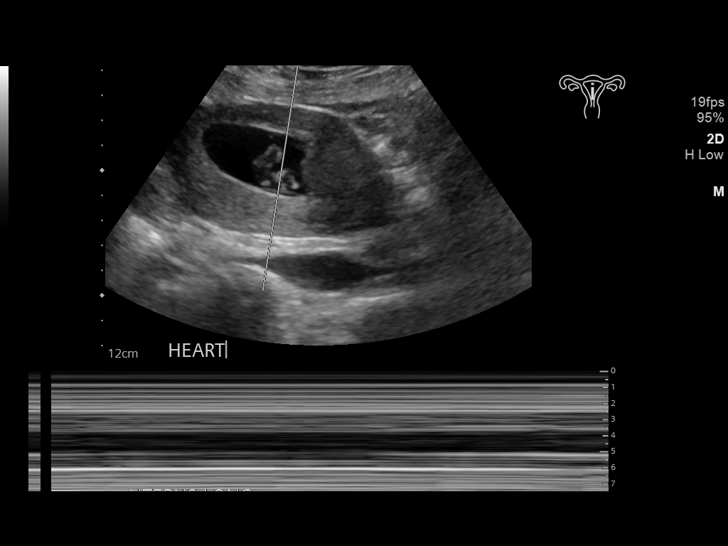
[im 31/122]
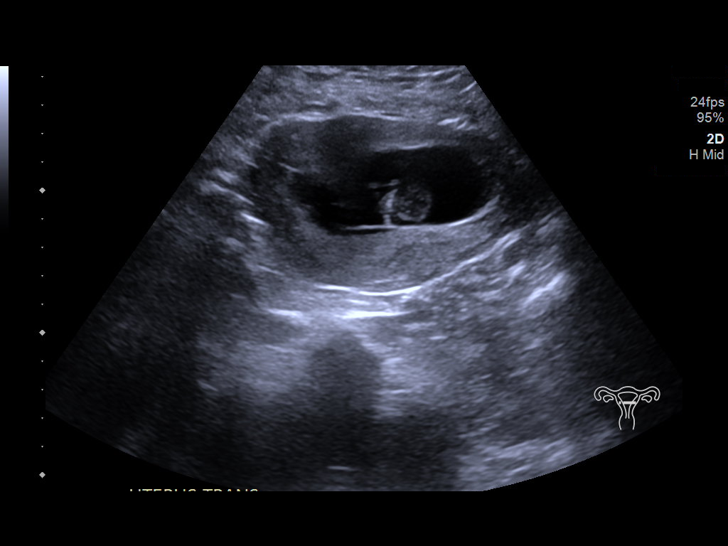
[im 41/122]
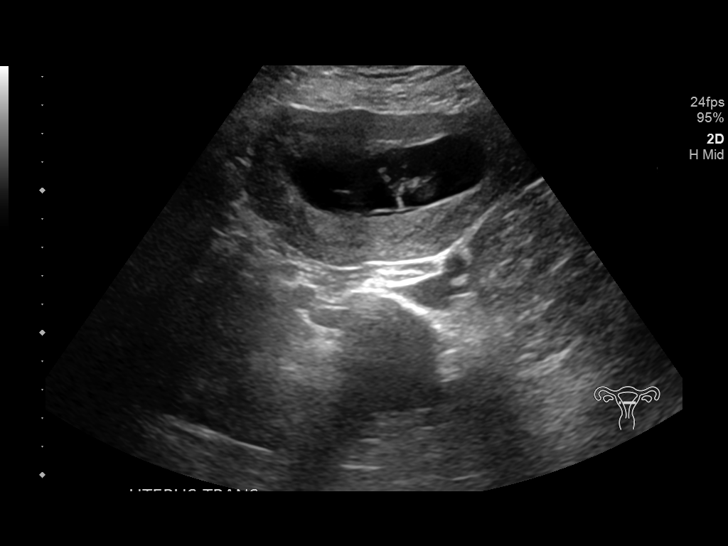
[im 46/122]
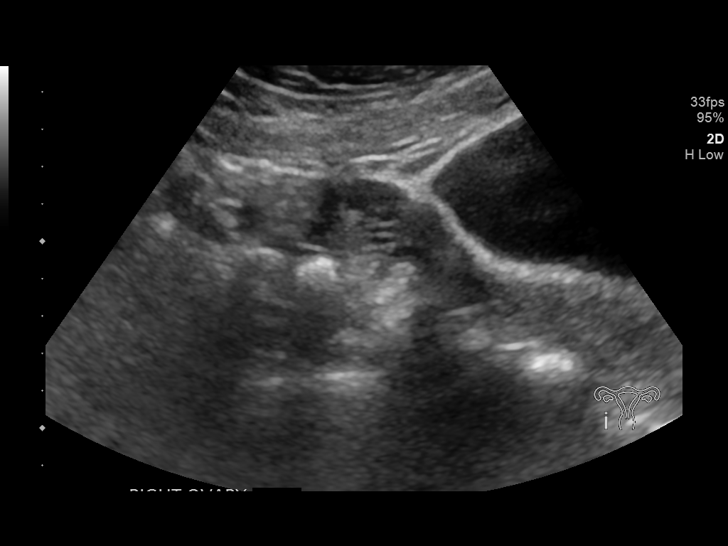
[im 56/122]
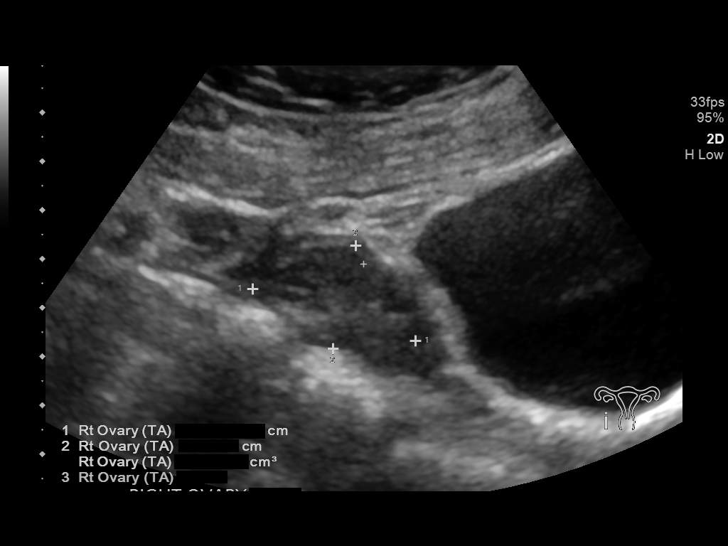
[im 66/122]
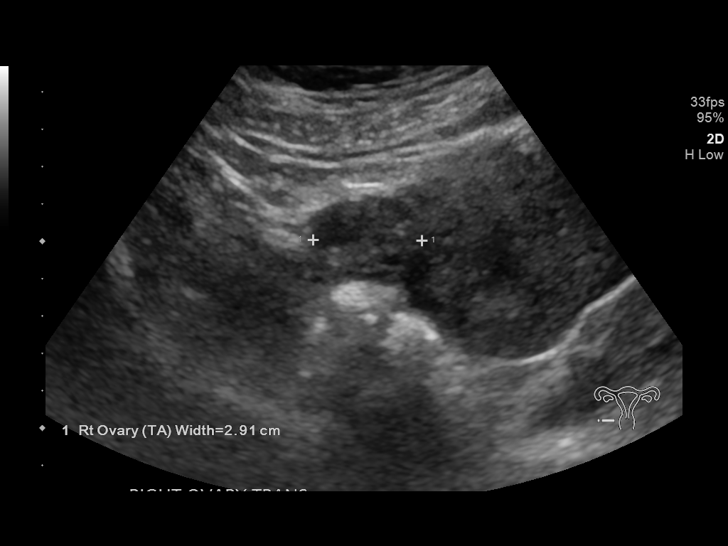
[im 76/122]
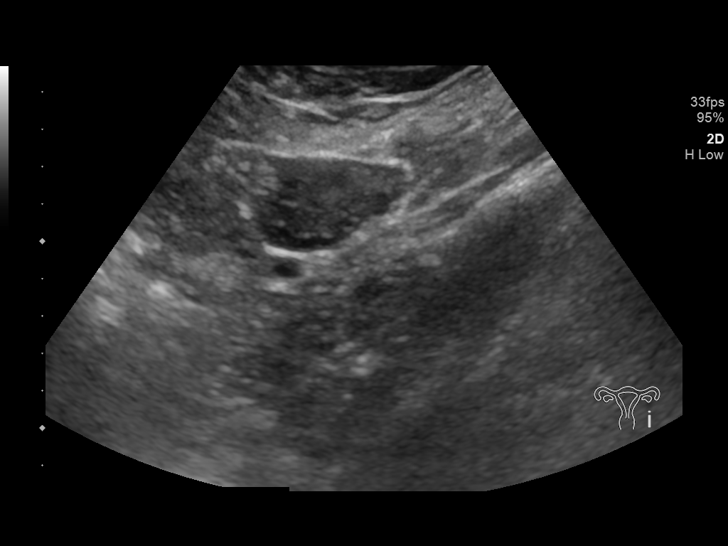
[im 81/122]
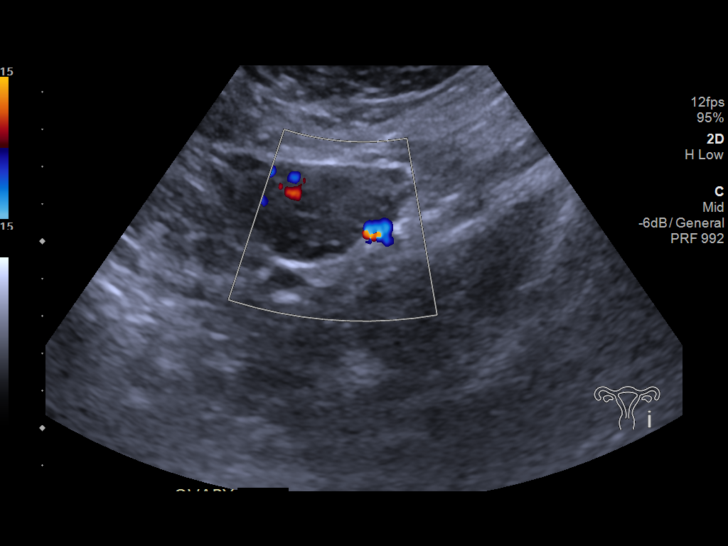
[im 91/122]
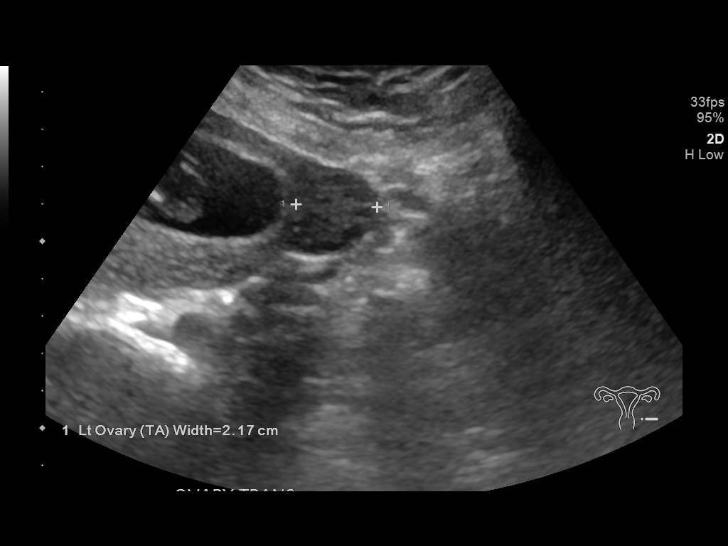
[im 101/122]
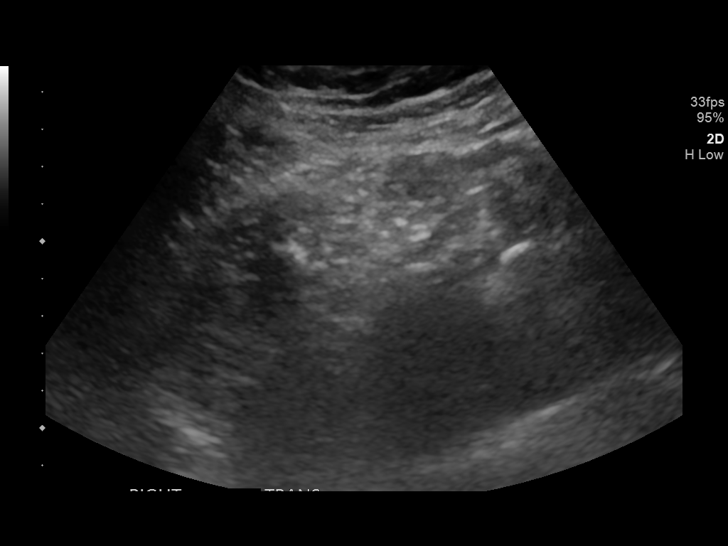
[im 111/122]
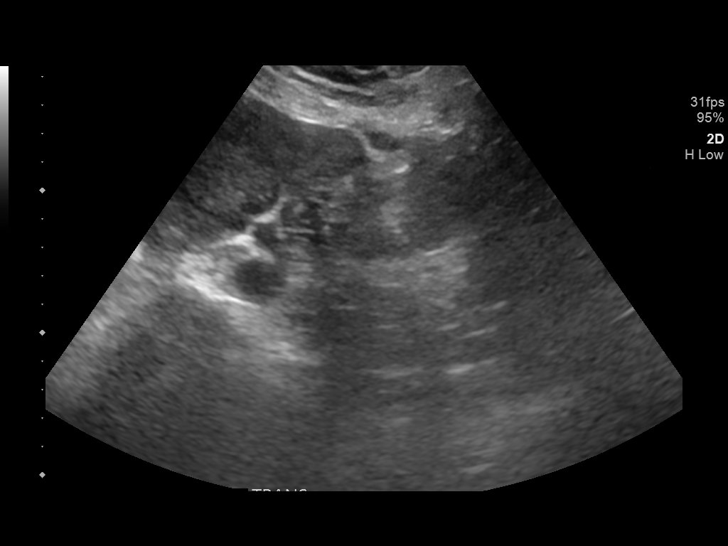
[im 122/122]
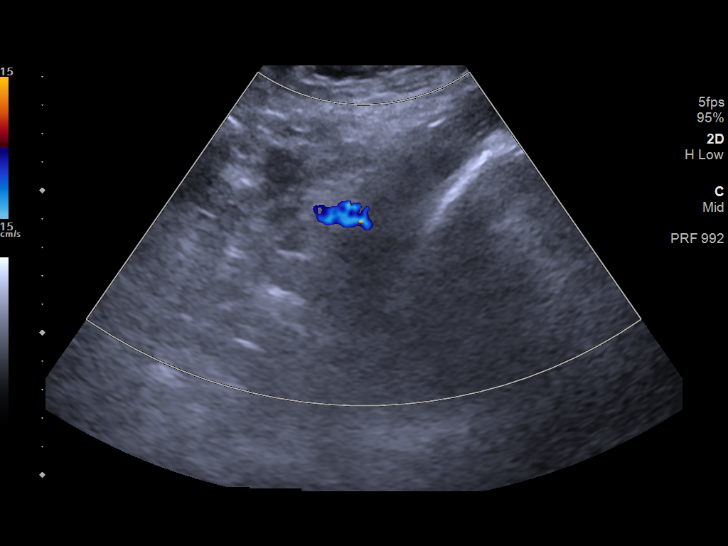

[14 of 25 positions shown; findings below may reference images not displayed]

FINDINGS: Uterus

Measurements: Measurements not provided. Uterus is anteverted. No
discrete fibroid or other mass.

Endometrium

Gestational sac with reproductive component seen within the
endometrial cavity. No visible cardiac activity.

Right ovary

Measurements: 3.5 x 2.2 x 2.9 cm = volume: 11.5 mL. Normal
appearance/no adnexal mass.

Left ovary

Measurements: 4.3 x 2.5 x 2.2 cm = volume: 12.4 mL. Normal
appearance/no adnexal mass.

Other findings

No abnormal free fluid.
IMPRESSION: 1. Gestational sac without cardiac activity, concerning for retained
products of conception given provided history. At time of this
dictation, patient has already undergone D & E.
2. Otherwise unremarkable pelvic ultrasound. No other acute
abnormality.
